# Patient Record
Sex: Female | Born: 1965 | Race: White | Hispanic: No | Marital: Single | Smoking: Never smoker
Health system: Southern US, Community
[De-identification: ages and names within clinical notes are randomized; demographics above are authoritative.]

## PROBLEM LIST (undated history)

## (undated) DIAGNOSIS — M1990 Unspecified osteoarthritis, unspecified site: Secondary | ICD-10-CM

## (undated) DIAGNOSIS — T7840XA Allergy, unspecified, initial encounter: Secondary | ICD-10-CM

## (undated) DIAGNOSIS — R011 Cardiac murmur, unspecified: Secondary | ICD-10-CM

## (undated) DIAGNOSIS — F419 Anxiety disorder, unspecified: Secondary | ICD-10-CM

## (undated) DIAGNOSIS — E785 Hyperlipidemia, unspecified: Secondary | ICD-10-CM

## (undated) DIAGNOSIS — E039 Hypothyroidism, unspecified: Secondary | ICD-10-CM

## (undated) DIAGNOSIS — K219 Gastro-esophageal reflux disease without esophagitis: Secondary | ICD-10-CM

## (undated) DIAGNOSIS — F32A Depression, unspecified: Secondary | ICD-10-CM

## (undated) DIAGNOSIS — D649 Anemia, unspecified: Secondary | ICD-10-CM

## (undated) DIAGNOSIS — E119 Type 2 diabetes mellitus without complications: Secondary | ICD-10-CM

## (undated) HISTORY — PX: WISDOM TOOTH EXTRACTION: SHX21

## (undated) HISTORY — DX: Anemia, unspecified: D64.9

## (undated) HISTORY — DX: Depression, unspecified: F32.A

## (undated) HISTORY — DX: Hyperlipidemia, unspecified: E78.5

## (undated) HISTORY — DX: Allergy, unspecified, initial encounter: T78.40XA

## (undated) HISTORY — DX: Hypothyroidism, unspecified: E03.9

## (undated) HISTORY — DX: Anxiety disorder, unspecified: F41.9

## (undated) HISTORY — DX: Gastro-esophageal reflux disease without esophagitis: K21.9

## (undated) HISTORY — DX: Type 2 diabetes mellitus without complications: E11.9

## (undated) HISTORY — DX: Cardiac murmur, unspecified: R01.1

## (undated) HISTORY — DX: Unspecified osteoarthritis, unspecified site: M19.90

---

## 2000-08-06 ENCOUNTER — Encounter: Admission: RE | Admit: 2000-08-06 | Discharge: 2000-08-06 | Payer: Self-pay | Admitting: Internal Medicine

## 2000-08-06 ENCOUNTER — Encounter: Payer: Self-pay | Admitting: Internal Medicine

## 2003-11-17 ENCOUNTER — Encounter: Admission: RE | Admit: 2003-11-17 | Discharge: 2003-11-17 | Payer: Self-pay | Admitting: Family Medicine

## 2005-06-26 ENCOUNTER — Encounter: Admission: RE | Admit: 2005-06-26 | Discharge: 2005-06-26 | Payer: Self-pay | Admitting: Family Medicine

## 2005-07-19 ENCOUNTER — Encounter: Admission: RE | Admit: 2005-07-19 | Discharge: 2005-07-19 | Payer: Self-pay | Admitting: Family Medicine

## 2006-10-11 ENCOUNTER — Encounter: Admission: RE | Admit: 2006-10-11 | Discharge: 2006-10-11 | Payer: Self-pay | Admitting: Family Medicine

## 2008-05-28 ENCOUNTER — Other Ambulatory Visit: Admission: RE | Admit: 2008-05-28 | Discharge: 2008-05-28 | Payer: Self-pay | Admitting: Family Medicine

## 2008-05-28 ENCOUNTER — Ambulatory Visit: Payer: Self-pay | Admitting: Family Medicine

## 2008-05-28 ENCOUNTER — Encounter: Payer: Self-pay | Admitting: Family Medicine

## 2008-05-28 DIAGNOSIS — E039 Hypothyroidism, unspecified: Secondary | ICD-10-CM

## 2008-05-28 DIAGNOSIS — K219 Gastro-esophageal reflux disease without esophagitis: Secondary | ICD-10-CM

## 2008-05-28 DIAGNOSIS — G5600 Carpal tunnel syndrome, unspecified upper limb: Secondary | ICD-10-CM

## 2008-05-28 DIAGNOSIS — E785 Hyperlipidemia, unspecified: Secondary | ICD-10-CM

## 2008-05-31 ENCOUNTER — Encounter (INDEPENDENT_AMBULATORY_CARE_PROVIDER_SITE_OTHER): Payer: Self-pay | Admitting: *Deleted

## 2008-06-03 ENCOUNTER — Encounter (INDEPENDENT_AMBULATORY_CARE_PROVIDER_SITE_OTHER): Payer: Self-pay | Admitting: *Deleted

## 2008-06-07 ENCOUNTER — Encounter: Payer: Self-pay | Admitting: Family Medicine

## 2009-05-18 ENCOUNTER — Ambulatory Visit: Payer: Self-pay | Admitting: Family Medicine

## 2009-05-18 ENCOUNTER — Telehealth (INDEPENDENT_AMBULATORY_CARE_PROVIDER_SITE_OTHER): Payer: Self-pay | Admitting: *Deleted

## 2009-05-18 DIAGNOSIS — J209 Acute bronchitis, unspecified: Secondary | ICD-10-CM

## 2009-08-25 ENCOUNTER — Ambulatory Visit: Payer: Self-pay | Admitting: Family Medicine

## 2009-08-25 DIAGNOSIS — L738 Other specified follicular disorders: Secondary | ICD-10-CM | POA: Insufficient documentation

## 2009-09-21 ENCOUNTER — Telehealth (INDEPENDENT_AMBULATORY_CARE_PROVIDER_SITE_OTHER): Payer: Self-pay | Admitting: *Deleted

## 2009-10-03 ENCOUNTER — Ambulatory Visit: Payer: Self-pay | Admitting: Family Medicine

## 2009-10-03 ENCOUNTER — Other Ambulatory Visit: Admission: RE | Admit: 2009-10-03 | Discharge: 2009-10-03 | Payer: Self-pay | Admitting: Family Medicine

## 2009-10-04 ENCOUNTER — Encounter: Admission: RE | Admit: 2009-10-04 | Discharge: 2009-10-04 | Payer: Self-pay | Admitting: Family Medicine

## 2009-10-17 ENCOUNTER — Encounter: Payer: Self-pay | Admitting: Family Medicine

## 2009-10-24 ENCOUNTER — Encounter: Payer: Self-pay | Admitting: Family Medicine

## 2009-10-24 ENCOUNTER — Telehealth (INDEPENDENT_AMBULATORY_CARE_PROVIDER_SITE_OTHER): Payer: Self-pay | Admitting: *Deleted

## 2009-10-25 ENCOUNTER — Ambulatory Visit: Payer: Self-pay | Admitting: Family Medicine

## 2009-10-25 DIAGNOSIS — M79609 Pain in unspecified limb: Secondary | ICD-10-CM | POA: Insufficient documentation

## 2009-11-11 ENCOUNTER — Telehealth: Payer: Self-pay | Admitting: Family Medicine

## 2009-12-29 ENCOUNTER — Ambulatory Visit: Payer: Self-pay | Admitting: Family Medicine

## 2010-04-16 ENCOUNTER — Encounter: Payer: Self-pay | Admitting: Family Medicine

## 2010-04-23 LAB — CONVERTED CEMR LAB
AST: 14 units/L (ref 0–37)
Albumin: 4 g/dL (ref 3.5–5.2)
Albumin: 4.4 g/dL (ref 3.5–5.2)
Basophils Relative: 0.8 % (ref 0.0–3.0)
Blood in Urine, dipstick: NEGATIVE
CO2: 22 meq/L (ref 19–32)
CO2: 27 meq/L (ref 19–32)
Chloride: 102 meq/L (ref 96–112)
Chloride: 109 meq/L (ref 96–112)
Cholesterol: 207 mg/dL — ABNORMAL HIGH (ref 0–200)
Creatinine, Ser: 0.8 mg/dL (ref 0.4–1.2)
Eosinophils Absolute: 0.3 10*3/uL (ref 0.0–0.7)
Free T4: 0.81 ng/dL (ref 0.60–1.60)
Glucose, Urine, Semiquant: NEGATIVE
HCT: 33.2 % — ABNORMAL LOW (ref 36.0–46.0)
HDL: 46 mg/dL (ref >39)
Hemoglobin: 11.1 g/dL — ABNORMAL LOW (ref 12.0–15.0)
Hemoglobin: 12.5 g/dL (ref 12.0–15.0)
Ketones, urine, test strip: NEGATIVE
LDL Cholesterol: 129 mg/dL — ABNORMAL HIGH (ref 0–99)
Lymphs Abs: 1.8 10*3/uL (ref 0.7–4.0)
Lymphs Abs: 2.7 10*3/uL (ref 0.7–4.0)
MCHC: 33.4 g/dL (ref 30.0–36.0)
MCV: 78.3 fL (ref 78.0–100.0)
Monocytes Absolute: 0.5 10*3/uL (ref 0.1–1.0)
Monocytes Relative: 6 % (ref 3–12)
Neutro Abs: 1.9 10*3/uL (ref 1.4–7.7)
Neutro Abs: 6.5 10*3/uL (ref 1.7–7.7)
Neutrophils Relative %: 35 % — ABNORMAL LOW (ref 43.0–77.0)
Neutrophils Relative %: 72 % (ref 43–77)
Nitrite: NEGATIVE
Potassium: 4.2 meq/L (ref 3.5–5.3)
RBC: 4.24 M/uL (ref 3.87–5.11)
RBC: 4.83 M/uL (ref 3.87–5.11)
Sodium: 138 meq/L (ref 135–145)
Specific Gravity, Urine: 1.02
T3, Free: 2.8 pg/mL (ref 2.3–4.2)
T3, Free: 3 pg/mL (ref 2.3–4.2)
TSH: 3.06 microintl units/mL (ref 0.35–5.50)
TSH: 3.133 microintl units/mL (ref 0.350–4.500)
Total Bilirubin: 0.7 mg/dL (ref 0.3–1.2)
Total CHOL/HDL Ratio: 4
Total CHOL/HDL Ratio: 4.1
Total Protein: 6.7 g/dL (ref 6.0–8.3)
pH: 5

## 2010-04-25 NOTE — Letter (Signed)
Summary: [facility]  [facility]   Imported By: Lanelle Bal 10/28/2009 10:02:33  _____________________________________________________________________  External Attachment:    Type:   Image     Comment:   External Document

## 2010-04-25 NOTE — Progress Notes (Signed)
Summary: Medication Request--l/m 8-1  Phone Note Call from Patient Call back at Home Phone [phone]   Caller: Patient Call For: Loreen Freud DO Summary of Call: Patient saw Dr. Beverely Low back in May or June for a rash under her arms.  She was prescribed an antibiotic which didn't make it go away completely.  Patient is requesting a stronger antibiotic that will make it go completely away. Initial call taken by: Barnie Mort,  October 24, 2009 1:54 PM  Follow-up for Phone Call        she would have to come in and let me see it. Follow-up by: Loreen Freud DO,  October 24, 2009 2:47 PM  Additional Follow-up for Phone Call Additional follow up Details #1::        left message on voicemail to call back to office. Lucious Groves CMA  October 24, 2009 3:05 PM     Additional Follow-up for Phone Call Additional follow up Details #2::    pt called back and has an appt tomorrow 8/2 @ 2pm..Kalyn Negrete  October 24, 2009 3:11 PM

## 2010-04-25 NOTE — Assessment & Plan Note (Signed)
Summary: cpx//lch   Vital Signs:  Patient profile:   45 year old female Height:      67 inches Weight:      206 pounds Temp:     98.6 degrees F oral Pulse rate:   62 / minute Resp:     18 per minute BP sitting:   141 / 76  (left arm)  Vitals Entered By: Jeremy Johann CMA (October 03, 2009 1:16 PM) CC: [email or web address],pap Comments REVIEWED MED LIST, PATIENT AGREED DOSE AND INSTRUCTION CORRECT    History of Present Illness: Pt here for cpe, pap and labs.   Pt has tried coming off minocycline but breaks out whenever she does.  She would like lower dose though.    Preventive Screening-Counseling & Management  Alcohol-Tobacco     Alcohol drinks/day: 0     Alcohol type: wine     Smoking Status: never     Passive Smoke Exposure: no  Caffeine-Diet-Exercise     Caffeine use/day: 1     Does Patient Exercise: no  Hep-HIV-STD-Contraception     HIV Risk: no     Sun Exposure-Excessive: occasionally  Safety-Violence-Falls     Seat Belt Use: 100  Current Medications (verified): 1)  Prilosec Otc 20 Mg Tbec (Omeprazole Magnesium) .... Take 1 Tab Once Daily 2)  Minocin 50 Mg Caps (Minocycline Hcl) .Marland Kitchen.. 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 HTN-father breast CA-maternal GM, mother F--nonmalignant brain tumor, CHF, HTN  Social History: Reviewed history from 05/28/2008 and no changes required. 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 Eyes:  Denies blurring, discharge, double vision, eye irritation, eye pain, halos, itching, light sensitivity, red eye, vision loss-1 eye, and vision loss-both eyes; optho--q1y. ENT:  Denies decreased hearing, difficulty swallowing, ear discharge, earache, hoarseness, nasal congestion, nosebleeds, postnasal drainage, ringing in ears, sinus pressure, and sore throat. CV:  Denies bluish discoloration of lips or nails, chest pain or discomfort, difficulty breathing at night, difficulty breathing while lying down, fainting, fatigue, leg cramps with exertion, lightheadness, near fainting, palpitations, shortness of breath with exertion, swelling of feet, swelling of hands, and weight gain. Resp:  Denies chest discomfort, chest pain with inspiration, cough, coughing up blood, excessive snoring, hypersomnolence, morning headaches, pleuritic, shortness of breath, sputum productive, and wheezing. GI:  Denies abdominal pain, bloody stools, change in bowel habits, constipation, dark tarry stools, diarrhea, excessive appetite, gas, hemorrhoids, indigestion, loss of appetite, nausea, vomiting, vomiting blood, and yellowish skin color. GU:  Denies abnormal vaginal bleeding, decreased libido, discharge, dysuria, genital sores, hematuria, incontinence, nocturia, urinary frequency, and urinary hesitancy. MS:  Denies joint pain, joint redness, joint swelling, loss of strength, low back  pain, mid back pain, muscle aches, muscle , cramps, muscle weakness, stiffness, and  thoracic pain. Derm:  Denies changes in color of skin, changes in nail beds, dryness, excessive perspiration, flushing, hair loss, insect bite(s), itching, lesion(s), poor wound healing, and rash. Neuro:  Denies brief paralysis, difficulty with concentration, disturbances in coordination, falling down, headaches, inability to speak, memory loss, numbness, poor balance, seizures, sensation of room spinning, tingling, tremors, visual disturbances, and weakness. Psych:  Denies alternate hallucination ( auditory/visual), anxiety, depression, easily angered, easily tearful, irritability, mental problems, panic attacks, sense of great danger, suicidal thoughts/plans, thoughts of violence, unusual visions or sounds, and thoughts /plans of harming others. Endo:  Denies cold intolerance, excessive hunger, excessive thirst, excessive urination, heat intolerance, polyuria, and weight change. Heme:  Denies abnormal bruising, bleeding, enlarge lymph nodes, fevers, pallor, and skin discoloration. Allergy:  Denies hives or rash, itching eyes, persistent infections, seasonal allergies, and sneezing.  Physical Exam  General:  Well-developed,well-nourished,in no acute distress; alert,appropriate and cooperative throughout examination Head:  Normocephalic and atraumatic without obvious abnormalities. No apparent alopecia or balding. Eyes:  vision grossly intact, pupils equal, pupils round, pupils reactive to light, and no injection.   Ears:  External ear exam shows no significant lesions or deformities.  Otoscopic examination reveals clear canals, tympanic membranes are intact bilaterally without bulging, retraction, inflammation or discharge. Hearing is grossly normal bilaterally. Nose:  External nasal examination shows no deformity or inflammation. Nasal mucosa are pink and moist without lesions or exudates. Mouth:  Oral mucosa and oropharynx without lesions or exudates.  Teeth in good repair. Neck:  No deformities,  masses, or tenderness noted.no carotid bruits.   Chest Wall:  No deformities, masses, or tenderness noted. Breasts:  No mass, nodules, thickening, tenderness, bulging, retraction, inflamation, nipple discharge or skin changes noted.   Lungs:  Normal respiratory effort, chest expands symmetrically. Lungs are clear to auscultation, no crackles or wheezes. Heart:  normal rate and no murmur.   Abdomen:  Bowel sounds positive,abdomen soft and non-tender without masses, organomegaly or hernias noted. Rectal:  No external abnormalities noted. Normal sphincter tone. No rectal masses or tenderness. Genitalia:  Pelvic Exam:        External: normal female genitalia without lesions or masses        Vagina: normal without lesions or masses        Cervix: normal without lesions or masses        Adnexa: normal bimanual exam without masses or fullness        Uterus: normal by palpation        Pap smear: performed Msk:  normal ROM, no joint tenderness, no joint swelling, no joint warmth, no redness over joints, no joint deformities, no joint instability, and no crepitation.   Pulses:  R posterior tibial normal, R dorsalis pedis normal, R carotid normal, L posterior tibial normal, L dorsalis pedis normal, and L carotid normal.   Extremities:  No clubbing, cyanosis, edema, or deformity noted with normal full range of motion of all joints.   Neurologic:  No cranial nerve deficits noted. Station and gait are normal. Plantar reflexes are down-going bilaterally. DTRs are symmetrical throughout. Sensory, motor and coordinative functions appear intact. Skin:  Intact without suspicious lesions or rashes Cervical Nodes:  No lymphadenopathy noted Axillary Nodes:  No palpable lymphadenopathy Psych:  Cognition and judgment appear intact. Alert and cooperative with normal attention span and concentration. No apparent delusions, illusions, hallucinations   Impression & Recommendations:  Problem # 1:  PREVENTIVE HEALTH  CARE (ICD-V70.0) ghm utd Orders: Radiology Referral (Radiology) Venipuncture [phone]) TLB-Lipid Panel (80061-LIPID) TLB-BMP (Basic Metabolic Panel-BMET) (80048-METABOL) TLB-CBC Platelet - w/Differential (85025-CBCD) TLB-Hepatic/Liver Function Pnl (80076-HEPATIC) TLB-TSH (Thyroid Stimulating Hormone) (84443-TSH) TLB-T3, Free (Triiodothyronine) (84481-T3FREE) TLB-T4 (Thyrox), Free [phone]) EKG w/ Interpretation (93000)  Problem # 2:  HYPOTHYROIDISM (ICD-244.9)  Orders: Venipuncture (78469) TLB-Lipid Panel (80061-LIPID) TLB-BMP (Basic Metabolic Panel-BMET) (80048-METABOL) TLB-CBC Platelet - w/Differential (85025-CBCD) TLB-Hepatic/Liver Function Pnl (80076-HEPATIC) TLB-TSH (Thyroid Stimulating Hormone) (84443-TSH) TLB-T3, Free (Triiodothyronine) (84481-T3FREE) TLB-T4 (Thyrox), Free (62952-WU1L) EKG w/ Interpretation (93000)  Problem # 3:  HYPERLIPIDEMIA (ICD-272.4)  Orders: Venipuncture (24401) TLB-Lipid Panel (80061-LIPID) TLB-BMP (Basic Metabolic Panel-BMET) (80048-METABOL) TLB-CBC Platelet - w/Differential (85025-CBCD) TLB-Hepatic/Liver Function Pnl (80076-HEPATIC) TLB-TSH (Thyroid Stimulating Hormone) (84443-TSH) TLB-T3, Free (Triiodothyronine) (84481-T3FREE) TLB-T4 (Thyrox), Free (02725-DG6Y) EKG w/ Interpretation (93000)  Complete Medication List: 1)  Prilosec Otc 20 Mg Tbec (Omeprazole magnesium) .... Take 1 tab once daily 2)  Minocin 50 Mg Caps (Minocycline hcl) .Marland Kitchen.. 1 by mouth once daily Prescriptions: PRILOSEC OTC 20 MG TBEC (OMEPRAZOLE MAGNESIUM) take 1 tab once daily  #90 x 3   Entered and Authorized by:   Loreen Freud DO   Signed by:   Loreen Freud DO on 10/03/2009   Method used:   Electronically to        [facility] (retail)       [address]       [place], [place]  [address]       Ph: [phone]       Fax: [phone]   RxID:   [id number] MINOCIN 50 MG CAPS (MINOCYCLINE HCL) 1 by mouth once daily  #30  x 5   Entered and Authorized by:   Loreen Freud DO   Signed by:   Loreen Freud DO on 10/03/2009   Method used:   Electronically to        [facility] (retail)       [address]       [place], [place]  [address]       Ph: [phone]       Fax: [phone]   RxID:   [id number]    EKG  Procedure date:  10/03/2009  Findings:      sinus rhythm 67 bpm   Appended Document: cpx//lch   Tetanus/Td Vaccine    Vaccine Type: Tdap    Site: right deltoid    Mfr: GlaxoSmithKline    Dose: 0.5 ml    Route: IM    Given by: Jeremy Johann CMA    Exp. Date: 10/09/2010    Lot #: [id number]    VIS given: 02/11/07 version given October 03, 2009.

## 2010-04-25 NOTE — Assessment & Plan Note (Signed)
Summary: rash under both armpits//lch   Vital Signs:  Patient profile:   45 year old female Weight:      203 pounds Temp:     99.2 degrees F oral BP sitting:   120 / 70  (left arm)  Vitals Entered By: Doristine Devoid (August 25, 2009 2:57 PM)  History of Present Illness: 45 yo woman here today for rash under her armpits.  started 'a couple of months' ago.  'like i'm breaking out under my armpits'.  'everything irritates it- shaving, not shaving, wetness, deodorant'.  has had similar previous springs but this resolved spontaneously.  denies new deodorant, detergent, razor, soaps.  not itchy, but painful.  some of the areas have whiteheads.  Current Medications (verified): 1)  Prilosec Otc 20 Mg Tbec (Omeprazole Magnesium) .... Take 1 Tab Once Daily 2)  Minocycline Hcl 100 Mg Tabs (Minocycline Hcl) .Marland Kitchen.. 1 By Mouth Daily 3)  Cephalexin 500 Mg  Tabs (Cephalexin) .... Take One By Mouth 2 Times Daily X10 Days  Allergies (verified): No Known Drug Allergies  Review of Systems      See HPI  Physical Exam  General:  Well-developed,well-nourished,in no acute distress; alert,appropriate and cooperative throughout examination Skin:  folliculitis in axilla bilaterally, L>R.  some areas open and raw, others w/ whiteheads. Axillary Nodes:  mild LAD bilaterally   Impression & Recommendations:  Problem # 1:  FOLLICULITIS (ICD-704.8) Assessment New not consistent w/ appearance of MRSA.  start Keflex.  reviewed supportive care and red flags that should prompt return.  Pt expresses understanding and is in agreement w/ this plan.  Complete Medication List: 1)  Prilosec Otc 20 Mg Tbec (Omeprazole magnesium) .... Take 1 tab once daily 2)  Minocycline Hcl 100 Mg Tabs (Minocycline hcl) .Marland Kitchen.. 1 by mouth daily 3)  Cephalexin 500 Mg Tabs (Cephalexin) .... Take one by mouth 2 times daily x10 days  Patient Instructions: 1)  Use the Cephalexin as directed- take w/ food to avoid upset stomach 2)  Apply hot  compresses to the areas 3)  Avoid shaving 4)  Hydrocortisone cream as needed 5)  Use deodorant for sensitive skin 6)  If worsening or not improving- please call 7)  Hang in there! Prescriptions: CEPHALEXIN 500 MG  TABS (CEPHALEXIN) take one by mouth 2 times daily x10 days  #20 x 0   Entered and Authorized by:   Neena Rhymes MD   Signed by:   Neena Rhymes MD on 08/25/2009   Method used:   Electronically to        [facility] (retail)       [address]       [place], [place]  [address]       Ph: [phone]       Fax: [phone]   RxID:   [id number]

## 2010-04-25 NOTE — Assessment & Plan Note (Signed)
Summary: WHEEZING/KDC   Vital Signs:  Patient profile:   45 year old female Height:      67 inches Weight:      206 pounds BMI:     32.38 Temp:     98.2 degrees F oral Pulse rate:   76 / minute Pulse rhythm:   regular BP sitting:   122 / 80  (left arm) Cuff size:   regular  Vitals Entered By: Army Fossa CMA (May 18, 2009 11:30 AM) CC: Pt states that her breathing has changed, she breaths more heavily. She states that she is not SOB, last night she noticied some wheezing. , Cough   History of Present Illness:  Cough      This is a 45 year old woman who presents with Cough.  The patient reports productive cough and wheezing, but denies non-productive cough, pleuritic chest pain, shortness of breath, exertional dyspnea, fever, hemoptysis, and malaise.  The patient denies the following symptoms: cold/URI symptoms, sore throat, nasal congestion, chronic rhinitis, weight loss, acid reflux symptoms, and peripheral edema.  The cough is worse with lying down.  Ineffective prior treatments have included OTC cough medication.    Current Medications (verified): 1)  Prilosec Otc 20 Mg Tbec (Omeprazole Magnesium) .... Take 1 Tab Once Daily 2)  Minocycline Hcl 100 Mg Tabs (Minocycline Hcl) .Marland Kitchen.. 1 By Mouth Daily 3)  Zithromax Z-Pak 250 Mg Tabs (Azithromycin) .... As Directed  Allergies (verified): No Known Drug Allergies  Past History:  Past medical, surgical, family and social histories (including risk factors) reviewed for relevance to current acute and chronic problems.  Past Medical History: Reviewed history from 05/28/2008 and no changes required. GERD Hyperlipidemia Hypothyroidism  Past Surgical History: Reviewed history from 05/28/2008 and no changes required. none  Family History: Reviewed history from 05/28/2008 and no changes required. HTN-father breast CA-maternal GM, mother F--nonmalignant brain tumor, CHF, HTN  Social History: Reviewed history from  05/28/2008 and no changes required. 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  Otoscopic examination reveals clear canals, tympanic membranes are intact bilaterally without bulging, retraction, inflammation or discharge. Hearing is grossly normal bilaterally. Nose:  External nasal examination shows no deformity or inflammation. Nasal mucosa are pink and moist without lesions or exudates. Mouth:  Oral mucosa and oropharynx without lesions or exudates.  Teeth in good repair. Neck:  No deformities, masses, or tenderness noted. Lungs:  R decreased breath sounds and L decreased breath sounds.   Heart:  Normal rate and regular rhythm. S1 and S2 normal without gallop, murmur, click, rub or other extra sounds. Extremities:  No clubbing, cyanosis, edema, or deformity noted with normal full range of motion of all joints.   Skin:  Intact without suspicious lesions or rashes Cervical Nodes:  No lymphadenopathy noted Psych:  Cognition and judgment appear intact. Alert and cooperative with normal attention span and concentration. No apparent delusions, illusions, hallucinations   Impression & Recommendations:  Problem # 1:  ACUTE BRONCHITIS (ICD-466.0)  Her updated medication list for this problem includes:    Minocycline Hcl 100 Mg Tabs (Minocycline hcl) .Marland Kitchen... 1 by mouth daily    Zithromax Z-pak 250 Mg Tabs (Azithromycin) .Marland Kitchen... As directed    mucinex as needed   Orders: Nebulizer Tx (  54098) Albuterol Sulfate Sol 1mg  unit dose (J1914)  Take antibiotics and other medications as directed. Encouraged to push clear liquids, get enough rest, and take acetaminophen as needed. To be seen in 5-7 days if no  improvement, sooner if worse.  Complete Medication List: 1)  Prilosec Otc 20 Mg Tbec (Omeprazole magnesium) .... Take 1 tab once daily 2)  Minocycline Hcl 100 Mg Tabs (Minocycline hcl) .Marland Kitchen.. 1 by mouth daily 3)  Zithromax Z-pak 250 Mg Tabs (Azithromycin) .... As directed Prescriptions: ZITHROMAX Z-PAK 250 MG TABS (AZITHROMYCIN) as directed  #1 x 0   Entered and Authorized by:   Loreen Freud DO   Signed by:   Loreen Freud DO on 05/18/2009   Method used:   Electronically to        [facility] (retail)       [address]       [place], [place]  [address]       Ph: [phone]       Fax: [phone]   RxID:   [id number]    Medication Administration  Medication # 1:    Medication: Albuterol Sulfate Sol 1mg  unit dose    Diagnosis: ACUTE BRONCHITIS (ICD-466.0)  Orders Added: 1)  Nebulizer Tx [40347] 2)  Albuterol Sulfate Sol 1mg  unit dose [J7613] 3)  Est. Patient Level IV [42595]

## 2010-04-25 NOTE — Consult Note (Signed)
Summary: [facility]   Imported By: Lanelle Bal 11/03/2009 09:43:59  _____________________________________________________________________  External Attachment:    Type:   Image     Comment:   External Document

## 2010-04-25 NOTE — Progress Notes (Signed)
Summary: PHONE  Phone Note Call from Patient Call back at [facility] Phone [phone]   Caller: Patient Summary of Call: pATIENT STATES SHE IS WHEEZING. PATIENT STATES SHE WOKE UP IN THE MIDDLE OF THE NIGHT HAVING TROUBLE BREATHIG. GAVE PATIENT APP FOT THIS AM. IS THIS OK.  Follow-up for Phone Call        left message to call office to get further detail..................Marland KitchenFelecia Deloach CMA  May 18, 2009 8:31 AM   Additional Follow-up for Phone Call Additional follow up Details #1::        pt c/o heavy breathing, wheezing and rattling in chest, cough up brownish mucous. pt has pending appt this AM advise to keep appt but if airway feel constricted or breathing become difficult pt advise  ED, pt ok................Marland KitchenFelecia Deloach CMA  May 18, 2009 8:58 AM

## 2010-04-25 NOTE — Progress Notes (Signed)
Summary: Refill Request  Phone Note Refill Request Call back at [phone] Message from:  Pharmacy on September 21, 2009 3:34 PM  Refills Requested: Medication #1:  MINOCYCLINE HCL 100 MG TABS 1 by mouth daily.   Dosage confirmed as above?Dosage Confirmed   Supply Requested: 1 month   Last Refilled: 04/23/2009 [facility] [address]  Next Appointment Scheduled: 7.11.11 Initial call taken by: Harold Barban,  September 21, 2009 3:34 PM    New/Updated Medications: MINOCYCLINE HCL 100 MG TABS (MINOCYCLINE HCL) 1 by mouth daily ** APPOINTMENT DUE** Prescriptions: MINOCYCLINE HCL 100 MG TABS (MINOCYCLINE HCL) 1 by mouth daily ** APPOINTMENT DUE**  #30 x 0   Entered by:   Shonna Chock   Authorized by:   Loreen Freud DO   Signed by:   Shonna Chock on 09/21/2009   Method used:   Electronically to        [facility] (retail)       [address]       [place], [place]  [address]       Ph: [phone]       Fax: [phone]   RxID:   [id number]

## 2010-04-25 NOTE — Assessment & Plan Note (Signed)
Summary: RE OCCURING OF OLD DX//PH   Vital Signs:  Patient profile:   45 year old female Weight:      207.8 pounds Temp:     99.2 degrees F oral Pulse rate:   64 / minute Pulse rhythm:   regular BP sitting:   110 / 70  (right arm) Cuff size:   large  Vitals Entered By: Almeta Monas CMA Duncan Dull) (December 29, 2009 11:23 AM) CC: C/O FOLLICULITIS UNDER BOTH ARMS   History of Present Illness: Pt here c/o recurrent folliculitis.  Pt has been on bactrim in the past with good success.  No other complaints.    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 Take 1 Tab Once Daily 2)  Minocin 50 Mg Caps (Minocycline Hcl) .Marland Kitchen.. 1 By Mouth Once Daily 3)  Bactrim Ds 800-160 Mg Tabs (Sulfamethoxazole-Trimethoprim) .Marland Kitchen.. 1 By Mouth Two Times A Day For 7 Days.  Current Medications (verified): 1)  Prilosec Otc 20 Mg Tbec (Omeprazole Magnesium) .... Take 1 Tab Once Daily 2)  Minocin 50 Mg Caps (Minocycline Hcl) .Marland Kitchen.. 1 By Mouth Once Daily 3)  Bactrim Ds 800-160 Mg Tabs (Sulfamethoxazole-Trimethoprim) .Marland Kitchen.. 1 By Mouth Two Times A Day  Allergies (verified): No Known Drug Allergies  Past History:  Past medical, surgical, family and social histories (including risk factors) reviewed for relevance to current acute and chronic problems.  Past Medical History: Reviewed history from 05/28/2008 and no changes required. GERD Hyperlipidemia Hypothyroidism  Past Surgical History: Reviewed history from 05/28/2008 and no changes required. none  Family History: Reviewed history from 05/28/2008 and no changes required. HTN-father breast CA-maternal GM,  mother F--nonmalignant brain tumor, CHF, HTN  Social History: Reviewed history from 10/03/2009 and no changes required. 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 Alert and cooperative with normal attention span and concentration. No apparent delusions, illusions, hallucinations   Impression & Recommendations:  Problem # 1:  FOLLICULITIS (ICD-704.8) bactrim ds for 10 days  rto as needed] warm compresses  Complete Medication List: 1)  Prilosec Otc 20 Mg Tbec (Omeprazole magnesium) .... Take 1 tab once daily 2)  Minocin 50 Mg Caps (Minocycline hcl) .Marland Kitchen.. 1 by mouth once daily 3)  Bactrim Ds 800-160 Mg Tabs (Sulfamethoxazole-trimethoprim) .Marland Kitchen.. 1 by mouth two times a day Prescriptions: BACTRIM DS 800-160 MG TABS (SULFAMETHOXAZOLE-TRIMETHOPRIM) 1 by mouth two times a day  #20 x 0   Entered and Authorized by:   Loreen Freud DO   Signed by:   Loreen Freud DO on 12/29/2009   Method used:   Electronically to        [facility] (retail)       [address]       [place], [place]  [address]       Ph: [phone]  Fax: [phone]   RxID:   [id number]

## 2010-04-25 NOTE — Progress Notes (Signed)
Summary: question  Phone Note Call from Patient   Summary of Call: pt left VM that she finished med yesterday for infected hair follice. pt states that she still has some sore  Follow-up for Phone Call        Should the patient try another round of meds or be seen in office. Please advise. Follow-up by: Lucious Groves CMA,  November 11, 2009 3:23 PM  Additional Follow-up for Phone Call Additional follow up Details #1::        bactrim ds 1 by mouth two times a day for 7 days ---ov if still not improving Additional Follow-up by: Loreen Freud DO,  November 11, 2009 3:49 PM    Additional Follow-up for Phone Call Additional follow up Details #2::    Pt is aware. Army Fossa CMA  November 11, 2009 3:51 PM   New/Updated Medications: BACTRIM DS 800-160 MG TABS (SULFAMETHOXAZOLE-TRIMETHOPRIM) 1 by mouth two times a day for 7 days. Prescriptions: BACTRIM DS 800-160 MG TABS (SULFAMETHOXAZOLE-TRIMETHOPRIM) 1 by mouth two times a day for 7 days.  #14 x 0   Entered by:   Army Fossa CMA   Authorized by:   Loreen Freud DO   Signed by:   Army Fossa CMA on 11/11/2009   Method used:   Electronically to        [facility] (retail)       [address]       [place], [place]  [address]       Ph: [phone]       Fax: [phone]   RxID:   [id number]

## 2010-04-25 NOTE — Letter (Signed)
Summary: Cancer Screening/[facility] Personalized Risk Profile  Cancer Screening/[facility] Personalized Risk Profile   Imported By: Lanelle Bal 10/06/2009 13:27:56  _____________________________________________________________________  External Attachment:    Type:   Image     Comment:   External Document

## 2010-04-25 NOTE — Assessment & Plan Note (Signed)
Summary: rash under arms//kn   Vital Signs:  Patient profile:   45 year old female Height:      67 inches (170.18 cm) Weight:      206.25 pounds (93.75 kg) BMI:     32.42 Temp:     98.8 degrees F (37.11 degrees C) oral BP sitting:   138 / 80  (left arm) Cuff size:   regular  Vitals Entered By: Lucious Groves CMA (October 25, 2009 2:28 PM) CC: C/O rash that has returned under her arms and needs pinky looked at./kb Is Patient Diabetic? No Pain Assessment Patient in pain? no      Comments Patient notes that the rash had gotten better previously but returned. Patient notes that it is related to shaving/deodorant./kb   History of Present Illness: Pt here c/o rash under arm again --- see Dr Rennis Golden note.  Pt also c/o feeling pop in L pinky while pushing something.  Pt is unable to straighten pinky and it is painful.  Current Medications (verified): 1)  Prilosec Otc 20 Mg Tbec (Omeprazole Magnesium) .... Take 1 Tab Once Daily 2)  Minocin 50 Mg Caps (Minocycline Hcl) .Marland Kitchen.. 1 By Mouth Once Daily 3)  Keflex 500 Mg Caps (Cephalexin) .Marland Kitchen.. 1 By Mouth Two Times A Day  Allergies (verified): No Known Drug Allergies  Past History:  Past medical, surgical, family and social histories (including risk factors) reviewed for relevance to current acute and chronic problems.  Past Medical History: Reviewed history from 05/28/2008 and no changes required. GERD Hyperlipidemia Hypothyroidism  Past Surgical History: Reviewed history from 05/28/2008 and no changes required. none  Family History: Reviewed history from 05/28/2008 and no changes required. HTN-father breast CA-maternal GM, mother F--nonmalignant brain tumor, CHF, HTN  Social History: Reviewed history from 10/03/2009 and no changes required. 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     Impression & Recommendations:  Problem # 1:  FOLLICULITIS (ICD-704.8) keflex for 2 weeks  Problem # 2:  FINGER PAIN (ICD-729.5)  Orders: T-Hand Left 3 Views (73130TC) Splints- All Types (E4540)  Complete Medication List: 1)  Prilosec Otc 20 Mg Tbec (Omeprazole magnesium) .... Take 1 tab once daily 2)  Minocin 50 Mg Caps (Minocycline hcl) .Marland Kitchen.. 1 by mouth once daily 3)  Keflex 500 Mg Caps (Cephalexin) .Marland Kitchen.. 1 by mouth two times a day Prescriptions: KEFLEX 500 MG CAPS (CEPHALEXIN) 1 by mouth two times a day  #28 x 0   Entered and Authorized by:   Loreen Freud DO   Signed by:   Loreen Freud DO on 10/25/2009   Method used:   Electronically to        [facility] (retail)       [address]       [place], [place]  [address]       Ph: [phone]       Fax: [phone]   RxID:   [id number]

## 2010-07-10 ENCOUNTER — Ambulatory Visit (INDEPENDENT_AMBULATORY_CARE_PROVIDER_SITE_OTHER): Payer: BC Managed Care – PPO | Admitting: Internal Medicine

## 2010-07-10 ENCOUNTER — Encounter: Payer: Self-pay | Admitting: Internal Medicine

## 2010-07-10 DIAGNOSIS — Z9109 Other allergy status, other than to drugs and biological substances: Secondary | ICD-10-CM

## 2010-07-10 DIAGNOSIS — J309 Allergic rhinitis, unspecified: Secondary | ICD-10-CM

## 2010-07-10 MED ORDER — PREDNISONE 10 MG PO TABS: ORAL_TABLET | ORAL | Status: AC

## 2010-07-10 NOTE — Assessment & Plan Note (Addendum)
Suspect generalized itching is due to  allergies. The rash is only localized in the left arm and the itchiness is generalized. Will treat with Claritin and prednisone, if she is not better she certainly will need further evaluation.

## 2010-07-10 NOTE — Progress Notes (Signed)
  Subjective:    Patient ID: Kelli Evans, female    DOB: 07/09/1965, 45 y.o.   MRN: [id number]  HPI few days ago developed classic allergy symptoms (runny nose, cough, sore throat) but also develop a generalized itching. She started to avoid outdoors and started taking Claritin; she is feeling better except for the generalized itching. Today, she developed a rash at the inner aspect of the left arm  Past Medical History  Diagnosis Date  . GERD (gastroesophageal reflux disease)   . Hyperlipidemia   . Hypothyroidism      Review of Systems No smoker Not taking any new medications Denies any contact with poison ivy No nausea or vomiting No fever or joint aches    Objective:   Physical Exam  Constitutional: She appears well-developed and well-nourished. No distress.  HENT:  Mouth/Throat: No oropharyngeal exudate.  Eyes: Conjunctivae are normal. No scleral icterus.  Neck: Neck supple.  Cardiovascular: Normal rate, regular rhythm and normal heart sounds.   Pulmonary/Chest: Effort normal and breath sounds normal. No respiratory distress. She has no wheezes. She has no rales.  Skin:       Few, minute, round, macular lesion in the inner aspect of the left arm. No other skin lesions or rashes          Assessment & Plan:

## 2010-07-10 NOTE — Patient Instructions (Signed)
Prednisone as RX claritin 10 mg once a day every day until allergy season ends Call if rash extends, you have fever or if no better in few day

## 2010-07-12 ENCOUNTER — Telehealth: Payer: Self-pay | Admitting: *Deleted

## 2010-07-12 NOTE — Telephone Encounter (Signed)
Message left for patient to return my call.  

## 2010-07-12 NOTE — Telephone Encounter (Signed)
Spoke w/ pt says she is doing much better

## 2010-07-12 NOTE — Telephone Encounter (Signed)
Message copied by Army Fossa on Wed Jul 12, 2010 10:12 AM ------      Message from: Willow Ora      Created: Mon Jul 10, 2010  5:48 PM       Please check on her, better

## 2010-09-01 ENCOUNTER — Other Ambulatory Visit: Payer: Self-pay | Admitting: Family Medicine

## 2010-10-13 ENCOUNTER — Other Ambulatory Visit: Payer: Self-pay | Admitting: Family Medicine

## 2011-03-26 ENCOUNTER — Other Ambulatory Visit: Payer: Self-pay | Admitting: Family Medicine

## 2011-04-18 ENCOUNTER — Encounter: Payer: BC Managed Care – PPO | Admitting: Family Medicine

## 2011-07-30 ENCOUNTER — Ambulatory Visit (INDEPENDENT_AMBULATORY_CARE_PROVIDER_SITE_OTHER): Payer: BC Managed Care – PPO | Admitting: Family Medicine

## 2011-07-30 ENCOUNTER — Other Ambulatory Visit: Payer: Self-pay | Admitting: Family Medicine

## 2011-07-30 ENCOUNTER — Encounter: Payer: Self-pay | Admitting: Family Medicine

## 2011-07-30 VITALS — BP 120/68 | HR 76 | Temp 98.1°F | Resp 20 | Ht 66.5 in | Wt 215.0 lb

## 2011-07-30 DIAGNOSIS — F329 Major depressive disorder, single episode, unspecified: Secondary | ICD-10-CM

## 2011-07-30 DIAGNOSIS — Z1231 Encounter for screening mammogram for malignant neoplasm of breast: Secondary | ICD-10-CM

## 2011-07-30 DIAGNOSIS — K219 Gastro-esophageal reflux disease without esophagitis: Secondary | ICD-10-CM

## 2011-07-30 DIAGNOSIS — F410 Panic disorder [episodic paroxysmal anxiety] without agoraphobia: Secondary | ICD-10-CM

## 2011-07-30 DIAGNOSIS — R079 Chest pain, unspecified: Secondary | ICD-10-CM

## 2011-07-30 LAB — BASIC METABOLIC PANEL
CO2: 26 mEq/L (ref 19–32)
Calcium: 9 mg/dL (ref 8.4–10.5)
Creatinine, Ser: 0.7 mg/dL (ref 0.4–1.2)
Glucose, Bld: 94 mg/dL (ref 70–99)

## 2011-07-30 LAB — CBC WITH DIFFERENTIAL/PLATELET
Basophils Absolute: 0.1 10*3/uL (ref 0.0–0.1)
Eosinophils Absolute: 0.3 10*3/uL (ref 0.0–0.7)
Lymphocytes Relative: 20.6 % (ref 12.0–46.0)
MCHC: 33 g/dL (ref 30.0–36.0)
MCV: 83.8 fl (ref 78.0–100.0)
Monocytes Absolute: 0.4 10*3/uL (ref 0.1–1.0)
Neutro Abs: 5.9 10*3/uL (ref 1.4–7.7)
Neutrophils Relative %: 69.9 % (ref 43.0–77.0)
RDW: 13.4 % (ref 11.5–14.6)

## 2011-07-30 LAB — T4, FREE: Free T4: 0.88 ng/dL (ref 0.60–1.60)

## 2011-07-30 LAB — HEPATIC FUNCTION PANEL
Albumin: 3.9 g/dL (ref 3.5–5.2)
Alkaline Phosphatase: 55 U/L (ref 39–117)
Bilirubin, Direct: 0 mg/dL (ref 0.0–0.3)

## 2011-07-30 LAB — T3, FREE: T3, Free: 2.5 pg/mL (ref 2.3–4.2)

## 2011-07-30 LAB — VITAMIN B12: Vitamin B-12: 710 pg/mL (ref 211–911)

## 2011-07-30 MED ORDER — ALPRAZOLAM 0.25 MG PO TABS: ORAL_TABLET | ORAL | Status: DC

## 2011-07-30 MED ORDER — OMEPRAZOLE 20 MG PO CPDR: 20.0000 mg | DELAYED_RELEASE_CAPSULE | Freq: Every day | ORAL | Status: DC

## 2011-07-30 MED ORDER — VENLAFAXINE HCL ER 37.5 MG PO CP24: ORAL_CAPSULE | ORAL | Status: DC
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
## 2011-07-30 NOTE — Patient Instructions (Signed)

## 2011-08-08 ENCOUNTER — Ambulatory Visit
Admission: RE | Admit: 2011-08-08 | Discharge: 2011-08-08 | Disposition: A | Discharge status: Home / Self Care | Payer: BC Managed Care – PPO | Source: Ambulatory Visit | Attending: Family Medicine | Admitting: Family Medicine

## 2011-08-08 DIAGNOSIS — Z1231 Encounter for screening mammogram for malignant neoplasm of breast: Secondary | ICD-10-CM

## 2011-08-17 ENCOUNTER — Telehealth: Payer: Self-pay | Admitting: Family Medicine

## 2011-08-17 NOTE — Telephone Encounter (Signed)
Caller: Jennifer/Patient; PCP: Lelon Perla.; CB#: [phone]; ; ; Call regarding Cough/Congestion; Onset approx 08/10/11.  Now has face pain, "gunk in eye" that has gotten worse throughout the day.  Afebrile/tactile.  Cough producing yellow/ green sputum.  No current day appointments available.  Advised to call for appt [facility] on 08/18/11; UC if feels she needs to be seen sooner.  Caller agreed.

## 2011-08-17 NOTE — Telephone Encounter (Signed)
FYI to pt instructions given via CAN in previous note

## 2011-08-18 ENCOUNTER — Ambulatory Visit (INDEPENDENT_AMBULATORY_CARE_PROVIDER_SITE_OTHER): Payer: BC Managed Care – PPO | Admitting: Family Medicine

## 2011-08-18 ENCOUNTER — Encounter: Payer: Self-pay | Admitting: Family Medicine

## 2011-08-18 VITALS — BP 110/70 | HR 80 | Temp 98.5°F | Wt 208.1 lb

## 2011-08-18 DIAGNOSIS — H109 Unspecified conjunctivitis: Secondary | ICD-10-CM | POA: Insufficient documentation

## 2011-08-18 MED ORDER — TOBRAMYCIN-DEXAMETHASONE 0.3-0.1 % OP SUSP: 1.0000 [drp] | OPHTHALMIC | Status: AC

## 2011-08-18 NOTE — Patient Instructions (Signed)
Wash hands frequently/ try not to touch eye and wash linens/ pillowcases frequently  Do not use eye make up  Use cool compress when needed  Use the tobradex opthy solution as directed until better

## 2011-08-18 NOTE — Progress Notes (Signed)
Subjective:    Patient ID: Kelli Evans, female    DOB: [birth year], 46 y.o.   MRN: [id number]  HPI Here for ? Pink eye  Getting over a bad cold  Then some facial pain - thought poss sinus infx at first but now thinks she has pink eye  Some d/c and then swelling and redness of R eye - yesterday  This am, swollen shut and had to wipe away crust  Exp to child with pink eye at work   Is yellow d/c Burning and irritated  Vision is ok   Cold is getting better   No fever   Patient Active Problem List  Diagnoses  . HYPOTHYROIDISM  . HYPERLIPIDEMIA  . CARPAL TUNNEL SYNDROME, BILATERAL  . GERD  . Environmental allergies  . Conjunctivitis of right eye   Past Medical History  Diagnosis Date  . GERD (gastroesophageal reflux disease)   . Hyperlipidemia   . Hypothyroidism    No past surgical history on file. History  Substance Use Topics  . Smoking status: Never Smoker   . Smokeless tobacco: Not on file  . Alcohol Use: Yes   Family History  Problem Relation Age of Onset  . Hypertension Father   . Breast cancer Maternal Grandfather   . Breast cancer Mother   . Hypertension Father    No Known Allergies Current Outpatient Prescriptions on File Prior to Visit  Medication Sig Dispense Refill  . ALPRAZolam (XANAX) 0.25 MG tablet 1 po tid prn  30 tablet  0  . omeprazole (PRILOSEC) 20 MG capsule Take 1 capsule (20 mg total) by mouth daily.  43 capsule  11  . venlafaxine XR (EFFEXOR XR) 37.5 MG 24 hr capsule 1 po qd for 1 week then inc to 2 po qd  60 capsule  0      Review of Systems Review of Systems  Constitutional: Negative for fever, appetite change, fatigue and unexpected weight change.  Eyes: Negative for vision change/ pos for d/c and redness ENT pos for runny nose/ neg for ST or ear pain  Respiratory: Negative for cough and shortness of breath.   Cardiovascular: Negative for cp or palpitations    Gastrointestinal: Negative for nausea, diarrhea and constipation.    Genitourinary: Negative for urgency and frequency.  Skin: Negative for pallor or rash   Neurological: Negative for weakness, light-headedness, numbness and headaches.  Hematological: Negative for adenopathy. Does not bruise/bleed easily.  Psychiatric/Behavioral: Negative for dysphoric mood. The patient is not nervous/anxious.         Objective:   Physical Exam  Constitutional: She appears well-developed and well-nourished.  HENT:  Head: Normocephalic and atraumatic.  Right Ear: External ear normal.  Left Ear: External ear normal.  Mouth/Throat: Oropharynx is clear and moist. No oropharyngeal exudate.       Nares are injected and congested  No sinus tenderness  Eyes: EOM are normal. Pupils are equal, round, and reactive to light. Right eye exhibits discharge. No scleral icterus.       R conj is injected (non focally) with no evidence of trauma Some cloudy discharge noted Vision grossly normal  Neck: Normal range of motion. Neck supple. No JVD present.  Cardiovascular: Normal rate and regular rhythm.   Pulmonary/Chest: Effort normal and breath sounds normal. No respiratory distress. She has no wheezes.  Lymphadenopathy:    She has no cervical adenopathy.  Neurological: She is alert. No cranial nerve deficit.  Skin: Skin is warm and dry. No rash  noted. No erythema. No pallor.  Psychiatric: She has a normal mood and affect.          Assessment & Plan:

## 2011-08-18 NOTE — Assessment & Plan Note (Signed)
After exposure with child to it , also a cold  Due to colored drainage / marked injection- will cover with tobradex opthy susp every 4 hours Disc symptomatic care - see instructions on AVS  Disc hygiene  Update if not starting to improve over the weekend or worsening or vision change

## 2011-08-21 NOTE — Telephone Encounter (Signed)
Was she seen?  We can get her in today if necessary.

## 2011-08-21 NOTE — Telephone Encounter (Signed)
Patient was seen on 08/18/11 at [facility].      KP

## 2011-08-31 ENCOUNTER — Other Ambulatory Visit: Payer: Self-pay | Admitting: Family Medicine

## 2011-09-14 ENCOUNTER — Encounter: Payer: BC Managed Care – PPO | Admitting: Family Medicine

## 2011-10-29 ENCOUNTER — Ambulatory Visit (INDEPENDENT_AMBULATORY_CARE_PROVIDER_SITE_OTHER): Payer: BC Managed Care – PPO | Admitting: Family Medicine

## 2011-10-29 ENCOUNTER — Encounter: Payer: Self-pay | Admitting: Family Medicine

## 2011-10-29 ENCOUNTER — Other Ambulatory Visit (HOSPITAL_COMMUNITY)
Admission: RE | Admit: 2011-10-29 | Discharge: 2011-10-29 | Disposition: A | Discharge status: Home / Self Care | Payer: BC Managed Care – PPO | Source: Ambulatory Visit | Attending: Family Medicine | Admitting: Family Medicine

## 2011-10-29 VITALS — BP 118/64 | HR 72 | Temp 98.6°F | Ht 66.0 in | Wt 208.2 lb

## 2011-10-29 DIAGNOSIS — Z0000 Encounter for general adult medical examination without abnormal findings: Secondary | ICD-10-CM

## 2011-10-29 DIAGNOSIS — F419 Anxiety disorder, unspecified: Secondary | ICD-10-CM

## 2011-10-29 DIAGNOSIS — Z01419 Encounter for gynecological examination (general) (routine) without abnormal findings: Secondary | ICD-10-CM | POA: Insufficient documentation

## 2011-10-29 DIAGNOSIS — F411 Generalized anxiety disorder: Secondary | ICD-10-CM

## 2011-10-29 DIAGNOSIS — F418 Other specified anxiety disorders: Secondary | ICD-10-CM | POA: Insufficient documentation

## 2011-10-29 DIAGNOSIS — E039 Hypothyroidism, unspecified: Secondary | ICD-10-CM

## 2011-10-29 DIAGNOSIS — E785 Hyperlipidemia, unspecified: Secondary | ICD-10-CM

## 2011-10-29 DIAGNOSIS — Z124 Encounter for screening for malignant neoplasm of cervix: Secondary | ICD-10-CM

## 2011-10-29 LAB — CBC WITH DIFFERENTIAL/PLATELET
Basophils Relative: 0.8 % (ref 0.0–3.0)
Eosinophils Relative: 0.3 % (ref 0.0–5.0)
Lymphocytes Relative: 21.8 % (ref 12.0–46.0)
MCV: 84.6 fl (ref 78.0–100.0)
Monocytes Relative: 6 % (ref 3.0–12.0)
Neutrophils Relative %: 71.1 % (ref 43.0–77.0)
Platelets: 252 10*3/uL (ref 150.0–400.0)
RBC: 4.61 Mil/uL (ref 3.87–5.11)
WBC: 6.8 10*3/uL (ref 4.5–10.5)

## 2011-10-29 LAB — HEPATIC FUNCTION PANEL
ALT: 20 U/L (ref 0–35)
AST: 19 U/L (ref 0–37)
Alkaline Phosphatase: 62 U/L (ref 39–117)
Bilirubin, Direct: 0 mg/dL (ref 0.0–0.3)
Total Bilirubin: 0.8 mg/dL (ref 0.3–1.2)
Total Protein: 7.5 g/dL (ref 6.0–8.3)

## 2011-10-29 LAB — LIPID PANEL
Total CHOL/HDL Ratio: 4
Triglycerides: 63 mg/dL (ref 0.0–149.0)

## 2011-10-29 LAB — BASIC METABOLIC PANEL
BUN: 9 mg/dL (ref 6–23)
Calcium: 9.1 mg/dL (ref 8.4–10.5)
Chloride: 105 mEq/L (ref 96–112)
Creatinine, Ser: 0.6 mg/dL (ref 0.4–1.2)
GFR: 119.11 mL/min (ref >60.00)

## 2011-10-29 LAB — POCT URINALYSIS DIPSTICK
Bilirubin, UA: NEGATIVE
Ketones, UA: NEGATIVE
Leukocytes, UA: NEGATIVE
Nitrite, UA: NEGATIVE
pH, UA: 6

## 2011-10-29 LAB — TSH: TSH: 2.55 u[IU]/mL (ref 0.35–5.50)

## 2011-10-29 MED ORDER — VENLAFAXINE HCL ER 75 MG PO CP24: 75.0000 mg | ORAL_CAPSULE | Freq: Every day | ORAL | Status: DC

## 2011-10-29 NOTE — Assessment & Plan Note (Signed)
Refill effexor con't xanax prn

## 2011-10-29 NOTE — Assessment & Plan Note (Addendum)
Check labs 

## 2011-10-29 NOTE — Assessment & Plan Note (Signed)
con't meds  Check labs 

## 2011-10-29 NOTE — Patient Instructions (Signed)
Preventive Care for Adults, Female A healthy lifestyle and preventive care can promote health and wellness. Preventive health guidelines for women include the following key practices.  A routine yearly physical is a good way to check with your caregiver about your health and preventive screening. It is a chance to share any concerns and updates on your health, and to receive a thorough exam.   Visit your dentist for a routine exam and preventive care every 6 months. Brush your teeth twice a day and floss once a day. Good oral hygiene prevents tooth decay and gum disease.   The frequency of eye exams is based on your age, health, family medical history, use of contact lenses, and other factors. Follow your caregiver's recommendations for frequency of eye exams.   Eat a healthy diet. Foods like vegetables, fruits, whole grains, low-fat dairy products, and lean protein foods contain the nutrients you need without too many calories. Decrease your intake of foods high in solid fats, added sugars, and salt. Eat the right amount of calories for you.Get information about a proper diet from your caregiver, if necessary.   Regular physical exercise is one of the most important things you can do for your health. Most adults should get at least 150 minutes of moderate-intensity exercise (any activity that increases your heart rate and causes you to sweat) each week. In addition, most adults need muscle-strengthening exercises on 2 or more days a week.   Maintain a healthy weight. The body mass index (BMI) is a screening tool to identify possible weight problems. It provides an estimate of body fat based on height and weight. Your caregiver can help determine your BMI, and can help you achieve or maintain a healthy weight.For adults 20 years and older:   A BMI below 18.5 is considered underweight.   A BMI of 18.5 to 24.9 is normal.   A BMI of 25 to 29.9 is considered overweight.   A BMI of 30 and above is  considered obese.   Maintain normal blood lipids and cholesterol levels by exercising and minimizing your intake of saturated fat. Eat a balanced diet with plenty of fruit and vegetables. Blood tests for lipids and cholesterol should begin at age 20 and be repeated every 5 years. If your lipid or cholesterol levels are high, you are over 50, or you are at high risk for heart disease, you may need your cholesterol levels checked more frequently.Ongoing high lipid and cholesterol levels should be treated with medicines if diet and exercise are not effective.   If you smoke, find out from your caregiver how to quit. If you do not use tobacco, do not start.   If you are pregnant, do not drink alcohol. If you are breastfeeding, be very cautious about drinking alcohol. If you are not pregnant and choose to drink alcohol, do not exceed 1 drink per day. One drink is considered to be 12 ounces (355 mL) of beer, 5 ounces (148 mL) of wine, or 1.5 ounces (44 mL) of liquor.   Avoid use of street drugs. Do not share needles with anyone. Ask for help if you need support or instructions about stopping the use of drugs.   High blood pressure causes heart disease and increases the risk of stroke. Your blood pressure should be checked at least every 1 to 2 years. Ongoing high blood pressure should be treated with medicines if weight loss and exercise are not effective.   If you are 55 to 46   years old, ask your caregiver if you should take aspirin to prevent strokes.   Diabetes screening involves taking a blood sample to check your fasting blood sugar level. This should be done once every 3 years, after age 45, if you are within normal weight and without risk factors for diabetes. Testing should be considered at a younger age or be carried out more frequently if you are overweight and have at least 1 risk factor for diabetes.   Breast cancer screening is essential preventive care for women. You should practice "breast  self-awareness." This means understanding the normal appearance and feel of your breasts and may include breast self-examination. Any changes detected, no matter how small, should be reported to a caregiver. Women in their 20s and 30s should have a clinical breast exam (CBE) by a caregiver as part of a regular health exam every 1 to 3 years. After age 40, women should have a CBE every year. Starting at age 40, women should consider having a mammography (breast X-ray test) every year. Women who have a family history of breast cancer should talk to their caregiver about genetic screening. Women at a high risk of breast cancer should talk to their caregivers about having magnetic resonance imaging (MRI) and a mammography every year.   The Pap test is a screening test for cervical cancer. A Pap test can show cell changes on the cervix that might become cervical cancer if left untreated. A Pap test is a procedure in which cells are obtained and examined from the lower end of the uterus (cervix).   Women should have a Pap test starting at age 21.   Between ages 21 and 29, Pap tests should be repeated every 2 years.   Beginning at age 30, you should have a Pap test every 3 years as long as the past 3 Pap tests have been normal.   Some women have medical problems that increase the chance of getting cervical cancer. Talk to your caregiver about these problems. It is especially important to talk to your caregiver if a new problem develops soon after your last Pap test. In these cases, your caregiver may recommend more frequent screening and Pap tests.   The above recommendations are the same for women who have or have not gotten the vaccine for human papillomavirus (HPV).   If you had a hysterectomy for a problem that was not cancer or a condition that could lead to cancer, then you no longer need Pap tests. Even if you no longer need a Pap test, a regular exam is a good idea to make sure no other problems are  starting.   If you are between ages 65 and 70, and you have had normal Pap tests going back 10 years, you no longer need Pap tests. Even if you no longer need a Pap test, a regular exam is a good idea to make sure no other problems are starting.   If you have had past treatment for cervical cancer or a condition that could lead to cancer, you need Pap tests and screening for cancer for at least 20 years after your treatment.   If Pap tests have been discontinued, risk factors (such as a new sexual partner) need to be reassessed to determine if screening should be resumed.   The HPV test is an additional test that may be used for cervical cancer screening. The HPV test looks for the virus that can cause the cell changes on the cervix.   The cells collected during the Pap test can be tested for HPV. The HPV test could be used to screen women aged 30 years and older, and should be used in women of any age who have unclear Pap test results. After the age of 30, women should have HPV testing at the same frequency as a Pap test.   Colorectal cancer can be detected and often prevented. Most routine colorectal cancer screening begins at the age of 50 and continues through age 75. However, your caregiver may recommend screening at an earlier age if you have risk factors for colon cancer. On a yearly basis, your caregiver may provide home test kits to check for hidden blood in the stool. Use of a small camera at the end of a tube, to directly examine the colon (sigmoidoscopy or colonoscopy), can detect the earliest forms of colorectal cancer. Talk to your caregiver about this at age 50, when routine screening begins. Direct examination of the colon should be repeated every 5 to 10 years through age 75, unless early forms of pre-cancerous polyps or small growths are found.   Hepatitis C blood testing is recommended for all people born from 1945 through 1965 and any individual with known risks for hepatitis C.    Practice safe sex. Use condoms and avoid high-risk sexual practices to reduce the spread of sexually transmitted infections (STIs). STIs include gonorrhea, chlamydia, syphilis, trichomonas, herpes, HPV, and human immunodeficiency virus (HIV). Herpes, HIV, and HPV are viral illnesses that have no cure. They can result in disability, cancer, and death. Sexually active women aged 25 and younger should be checked for chlamydia. Older women with new or multiple partners should also be tested for chlamydia. Testing for other STIs is recommended if you are sexually active and at increased risk.   Osteoporosis is a disease in which the bones lose minerals and strength with aging. This can result in serious bone fractures. The risk of osteoporosis can be identified using a bone density scan. Women ages 65 and over and women at risk for fractures or osteoporosis should discuss screening with their caregivers. Ask your caregiver whether you should take a calcium supplement or vitamin D to reduce the rate of osteoporosis.   Menopause can be associated with physical symptoms and risks. Hormone replacement therapy is available to decrease symptoms and risks. You should talk to your caregiver about whether hormone replacement therapy is right for you.   Use sunscreen with sun protection factor (SPF) of 30 or more. Apply sunscreen liberally and repeatedly throughout the day. You should seek shade when your shadow is shorter than you. Protect yourself by wearing long sleeves, pants, a wide-brimmed hat, and sunglasses year round, whenever you are outdoors.   Once a month, do a whole body skin exam, using a mirror to look at the skin on your back. Notify your caregiver of new moles, moles that have irregular borders, moles that are larger than a pencil eraser, or moles that have changed in shape or color.   Stay current with required immunizations.   Influenza. You need a dose every fall (or winter). The composition of  the flu vaccine changes each year, so being vaccinated once is not enough.   Pneumococcal polysaccharide. You need 1 to 2 doses if you smoke cigarettes or if you have certain chronic medical conditions. You need 1 dose at age 65 (or older) if you have never been vaccinated.   Tetanus, diphtheria, pertussis (Tdap, Td). Get 1 dose of   Tdap vaccine if you are younger than age 65, are over 65 and have contact with an infant, are a healthcare worker, are pregnant, or simply want to be protected from whooping cough. After that, you need a Td booster dose every 10 years. Consult your caregiver if you have not had at least 3 tetanus and diphtheria-containing shots sometime in your life or have a deep or dirty wound.   HPV. You need this vaccine if you are a woman age 26 or younger. The vaccine is given in 3 doses over 6 months.   Measles, mumps, rubella (MMR). You need at least 1 dose of MMR if you were born in 1957 or later. You may also need a second dose.   Meningococcal. If you are age 19 to 21 and a first-year college student living in a residence hall, or have one of several medical conditions, you need to get vaccinated against meningococcal disease. You may also need additional booster doses.   Zoster (shingles). If you are age 60 or older, you should get this vaccine.   Varicella (chickenpox). If you have never had chickenpox or you were vaccinated but received only 1 dose, talk to your caregiver to find out if you need this vaccine.   Hepatitis A. You need this vaccine if you have a specific risk factor for hepatitis A virus infection or you simply wish to be protected from this disease. The vaccine is usually given as 2 doses, 6 to 18 months apart.   Hepatitis B. You need this vaccine if you have a specific risk factor for hepatitis B virus infection or you simply wish to be protected from this disease. The vaccine is given in 3 doses, usually over 6 months.  Preventive Services /  Frequency Ages 19 to 39  Blood pressure check.** / Every 1 to 2 years.   Lipid and cholesterol check.** / Every 5 years beginning at age 20.   Clinical breast exam.** / Every 3 years for women in their 20s and 30s.   Pap test.** / Every 2 years from ages 21 through 29. Every 3 years starting at age 30 through age 65 or 70 with a history of 3 consecutive normal Pap tests.   HPV screening.** / Every 3 years from ages 30 through ages 65 to 70 with a history of 3 consecutive normal Pap tests.   Hepatitis C blood test.** / For any individual with known risks for hepatitis C.   Skin self-exam. / Monthly.   Influenza immunization.** / Every year.   Pneumococcal polysaccharide immunization.** / 1 to 2 doses if you smoke cigarettes or if you have certain chronic medical conditions.   Tetanus, diphtheria, pertussis (Tdap, Td) immunization. / A one-time dose of Tdap vaccine. After that, you need a Td booster dose every 10 years.   HPV immunization. / 3 doses over 6 months, if you are 26 and younger.   Measles, mumps, rubella (MMR) immunization. / You need at least 1 dose of MMR if you were born in 1957 or later. You may also need a second dose.   Meningococcal immunization. / 1 dose if you are age 19 to 21 and a first-year college student living in a residence hall, or have one of several medical conditions, you need to get vaccinated against meningococcal disease. You may also need additional booster doses.   Varicella immunization.** / Consult your caregiver.   Hepatitis A immunization.** / Consult your caregiver. 2 doses, 6 to 18 months   apart.   Hepatitis B immunization.** / Consult your caregiver. 3 doses usually over 6 months.  Ages 40 to 64  Blood pressure check.** / Every 1 to 2 years.   Lipid and cholesterol check.** / Every 5 years beginning at age 20.   Clinical breast exam.** / Every year after age 40.   Mammogram.** / Every year beginning at age 40 and continuing for as  long as you are in good health. Consult with your caregiver.   Pap test.** / Every 3 years starting at age 30 through age 65 or 70 with a history of 3 consecutive normal Pap tests.   HPV screening.** / Every 3 years from ages 30 through ages 65 to 70 with a history of 3 consecutive normal Pap tests.   Fecal occult blood test (FOBT) of stool. / Every year beginning at age 50 and continuing until age 75. You may not need to do this test if you get a colonoscopy every 10 years.   Flexible sigmoidoscopy or colonoscopy.** / Every 5 years for a flexible sigmoidoscopy or every 10 years for a colonoscopy beginning at age 50 and continuing until age 75.   Hepatitis C blood test.** / For all people born from 1945 through 1965 and any individual with known risks for hepatitis C.   Skin self-exam. / Monthly.   Influenza immunization.** / Every year.   Pneumococcal polysaccharide immunization.** / 1 to 2 doses if you smoke cigarettes or if you have certain chronic medical conditions.   Tetanus, diphtheria, pertussis (Tdap, Td) immunization.** / A one-time dose of Tdap vaccine. After that, you need a Td booster dose every 10 years.   Measles, mumps, rubella (MMR) immunization. / You need at least 1 dose of MMR if you were born in 1957 or later. You may also need a second dose.   Varicella immunization.** / Consult your caregiver.   Meningococcal immunization.** / Consult your caregiver.   Hepatitis A immunization.** / Consult your caregiver. 2 doses, 6 to 18 months apart.   Hepatitis B immunization.** / Consult your caregiver. 3 doses, usually over 6 months.  Ages 65 and over  Blood pressure check.** / Every 1 to 2 years.   Lipid and cholesterol check.** / Every 5 years beginning at age 20.   Clinical breast exam.** / Every year after age 40.   Mammogram.** / Every year beginning at age 40 and continuing for as long as you are in good health. Consult with your caregiver.   Pap test.** /  Every 3 years starting at age 30 through age 65 or 70 with a 3 consecutive normal Pap tests. Testing can be stopped between 65 and 70 with 3 consecutive normal Pap tests and no abnormal Pap or HPV tests in the past 10 years.   HPV screening.** / Every 3 years from ages 30 through ages 65 or 70 with a history of 3 consecutive normal Pap tests. Testing can be stopped between 65 and 70 with 3 consecutive normal Pap tests and no abnormal Pap or HPV tests in the past 10 years.   Fecal occult blood test (FOBT) of stool. / Every year beginning at age 50 and continuing until age 75. You may not need to do this test if you get a colonoscopy every 10 years.   Flexible sigmoidoscopy or colonoscopy.** / Every 5 years for a flexible sigmoidoscopy or every 10 years for a colonoscopy beginning at age 50 and continuing until age 75.   Hepatitis   C blood test.** / For all people born from 1945 through 1965 and any individual with known risks for hepatitis C.   Osteoporosis screening.** / A one-time screening for women ages 65 and over and women at risk for fractures or osteoporosis.   Skin self-exam. / Monthly.   Influenza immunization.** / Every year.   Pneumococcal polysaccharide immunization.** / 1 dose at age 65 (or older) if you have never been vaccinated.   Tetanus, diphtheria, pertussis (Tdap, Td) immunization. / A one-time dose of Tdap vaccine if you are over 65 and have contact with an infant, are a healthcare worker, or simply want to be protected from whooping cough. After that, you need a Td booster dose every 10 years.   Varicella immunization.** / Consult your caregiver.   Meningococcal immunization.** / Consult your caregiver.   Hepatitis A immunization.** / Consult your caregiver. 2 doses, 6 to 18 months apart.   Hepatitis B immunization.** / Check with your caregiver. 3 doses, usually over 6 months.  ** Family history and personal history of risk and conditions may change your caregiver's  recommendations. Document Released: 05/08/2001 Document Revised: 03/01/2011 Document Reviewed: 08/07/2010 [employer] Patient Information 2012 [employer]. 

## 2011-10-29 NOTE — Progress Notes (Signed)
Subjective:     Kelli Evans is a 46 y.o. female and is here for a comprehensive physical exam. The patient reports no problems.  History   Social History  . Marital Status: Single    Spouse Name: N/A    Number of Children: N/A  . Years of Education: N/A   Occupational History  . Not on file.   Social History Main Topics  . Smoking status: Never Smoker   . Smokeless tobacco: Not on file  . Alcohol Use: Yes  . Drug Use: No  . Sexually Active: Not on file   Other Topics Concern  . Not on file   Social History Narrative   Exercise--  Walks dog 4-5 x a week---2-3 miles   Health Maintenance  Topic Date Due  . Influenza Vaccine  12/25/2011  . Mammogram  08/07/2012  . Pap Smear  10/29/2014  . Tetanus/tdap  10/04/2019    The following portions of the patient's history were reviewed and updated as appropriate: allergies, current medications, past family history, past medical history, past social history, past surgical history and problem list.  Review of Systems Review of Systems  Constitutional: Negative for activity change, appetite change and fatigue.  HENT: Negative for hearing loss, congestion, tinnitus and ear discharge.  dentist- q2y Eyes: Negative for visual disturbance (see optho q1y -- vision corrected to 20/20 with glasses).  Respiratory: Negative for cough, chest tightness and shortness of breath.   Cardiovascular: Negative for chest pain, palpitations and leg swelling.  Gastrointestinal: Negative for abdominal pain, diarrhea, constipation and abdominal distention.  Genitourinary: Negative for urgency, frequency, decreased urine volume and difficulty urinating.  Musculoskeletal: Negative for back pain, arthralgias and gait problem.  Skin: Negative for color change, pallor and rash.  Neurological: Negative for dizziness, light-headedness, numbness and headaches.  Hematological: Negative for adenopathy. Does not bruise/bleed easily.  Psychiatric/Behavioral: Negative  for suicidal ideas, confusion, sleep disturbance, self-injury, dysphoric mood, decreased concentration and agitation.       Objective:    BP 118/64  Pulse 72  Temp 98.6 F (37 C) (Oral)  Ht 5\' 6"  (1.676 m)  Wt 208 lb 3.2 oz (94.439 kg)  BMI 33.60 kg/m2  SpO2 97%  LMP 10/11/2011 General appearance: alert, cooperative, appears stated age and no distress Head: Normocephalic, without obvious abnormality, atraumatic Eyes: conjunctivae/corneas clear. PERRL, EOM's intact. Fundi benign. Ears: normal TM's and external ear canals both ears Nose: Nares normal. Septum midline. Mucosa normal. No drainage or sinus tenderness. Throat: lips, mucosa, and tongue normal; teeth and gums normal Neck: no adenopathy, no carotid bruit, no JVD, supple, symmetrical, trachea midline and thyroid not enlarged, symmetric, no tenderness/mass/nodules Back: symmetric, no curvature. ROM normal. No CVA tenderness. Lungs: clear to auscultation bilaterally Breasts: normal appearance, no masses or tenderness Heart: regular rate and rhythm, S1, S2 normal, no murmur, click, rub or gallop Abdomen: soft, non-tender; bowel sounds normal; no masses,  no organomegaly Pelvic: cervix normal in appearance, external genitalia normal, no adnexal masses or tenderness, no cervical motion tenderness, rectovaginal septum normal, uterus normal size, shape, and consistency and vagina normal without discharge--pap done Extremities: extremities normal, atraumatic, no cyanosis or edema Pulses: 2+ and symmetric Skin: Skin color, texture, turgor normal. No rashes or lesions Lymph nodes: Cervical, supraclavicular, and axillary nodes normal. Neurologic: Alert and oriented X 3, normal strength and tone. Normal symmetric reflexes. Normal coordination and gait psych-- no depression, anxiety    Assessment:    Healthy female exam.  Plan:    check labs ghm utd See After Visit Summary for Counseling Recommendations

## 2012-02-18 ENCOUNTER — Other Ambulatory Visit: Payer: Self-pay | Admitting: Family Medicine

## 2012-02-18 DIAGNOSIS — F410 Panic disorder [episodic paroxysmal anxiety] without agoraphobia: Secondary | ICD-10-CM

## 2012-02-18 MED ORDER — ALPRAZOLAM 0.25 MG PO TABS: ORAL_TABLET | ORAL | Status: DC

## 2012-02-18 NOTE — Telephone Encounter (Signed)
Refill x1   2 refills 

## 2012-02-18 NOTE — Telephone Encounter (Signed)
Please advise      KP 

## 2012-02-18 NOTE — Telephone Encounter (Signed)
refill ALPRAZolam (Tab) 0.25 MG 1 po tid prn last fill 5.6.13 last ov 8.5.13

## 2012-05-10 ENCOUNTER — Other Ambulatory Visit: Payer: Self-pay

## 2012-06-06 ENCOUNTER — Other Ambulatory Visit: Payer: Self-pay | Admitting: Family Medicine

## 2012-08-19 ENCOUNTER — Other Ambulatory Visit: Payer: Self-pay | Admitting: Family Medicine

## 2012-09-01 ENCOUNTER — Encounter: Payer: Self-pay | Admitting: Family Medicine

## 2012-09-01 ENCOUNTER — Ambulatory Visit (INDEPENDENT_AMBULATORY_CARE_PROVIDER_SITE_OTHER): Payer: BC Managed Care – PPO | Admitting: Family Medicine

## 2012-09-01 VITALS — BP 118/72 | HR 80 | Temp 99.3°F | Wt 216.0 lb

## 2012-09-01 DIAGNOSIS — F341 Dysthymic disorder: Secondary | ICD-10-CM

## 2012-09-01 DIAGNOSIS — F418 Other specified anxiety disorders: Secondary | ICD-10-CM

## 2012-09-01 DIAGNOSIS — F329 Major depressive disorder, single episode, unspecified: Secondary | ICD-10-CM

## 2012-09-01 MED ORDER — VENLAFAXINE HCL ER 150 MG PO CP24: 150.0000 mg | ORAL_CAPSULE | Freq: Every day | ORAL | Status: DC

## 2012-09-01 NOTE — Patient Instructions (Signed)

## 2012-09-01 NOTE — Progress Notes (Signed)
  Subjective:    Patient ID: Kelli Evans, female    DOB: Nov 20, 1965, 47 y.o.   MRN: [id number]  HPI Pt here f/u depression --- she is doing better with counseling and meds but still not as good as she thinks she could be. Pt is not suicidal.    Review of Systems As above    Objective:   Physical Exam  BP 118/72  Pulse 80  Temp(Src) 99.3 F (37.4 C) (Oral)  Wt 216 lb (97.977 kg)  BMI 34.88 kg/m2  SpO2 97% General appearance: alert, cooperative, appears stated age and no distress Neurologic: Grossly normal Psych--      Assessment & Plan:

## 2012-09-01 NOTE — Assessment & Plan Note (Signed)
Inc effexor to 150 mg daily F/u 4-6 weeks or sooner prn

## 2012-10-20 ENCOUNTER — Other Ambulatory Visit: Payer: Self-pay | Admitting: *Deleted

## 2012-10-20 MED ORDER — OMEPRAZOLE MAGNESIUM 20 MG PO TBEC: DELAYED_RELEASE_TABLET | ORAL | Status: DC

## 2012-10-20 NOTE — Telephone Encounter (Signed)
Rx was refilled for Prilosec OTC 40 mg 30 ct.

## 2012-10-31 ENCOUNTER — Other Ambulatory Visit (HOSPITAL_COMMUNITY)
Admission: RE | Admit: 2012-10-31 | Discharge: 2012-10-31 | Disposition: A | Discharge status: Home / Self Care | Payer: BC Managed Care – PPO | Source: Ambulatory Visit | Attending: Family Medicine | Admitting: Family Medicine

## 2012-10-31 ENCOUNTER — Encounter: Payer: Self-pay | Admitting: Family Medicine

## 2012-10-31 ENCOUNTER — Ambulatory Visit (INDEPENDENT_AMBULATORY_CARE_PROVIDER_SITE_OTHER): Payer: BC Managed Care – PPO | Admitting: Family Medicine

## 2012-10-31 VITALS — BP 118/76 | HR 86 | Temp 98.8°F | Ht 66.0 in | Wt 211.0 lb

## 2012-10-31 DIAGNOSIS — E669 Obesity, unspecified: Secondary | ICD-10-CM

## 2012-10-31 DIAGNOSIS — K219 Gastro-esophageal reflux disease without esophagitis: Secondary | ICD-10-CM

## 2012-10-31 DIAGNOSIS — E785 Hyperlipidemia, unspecified: Secondary | ICD-10-CM

## 2012-10-31 DIAGNOSIS — L708 Other acne: Secondary | ICD-10-CM

## 2012-10-31 DIAGNOSIS — Z0000 Encounter for general adult medical examination without abnormal findings: Secondary | ICD-10-CM

## 2012-10-31 DIAGNOSIS — Z1151 Encounter for screening for human papillomavirus (HPV): Secondary | ICD-10-CM | POA: Insufficient documentation

## 2012-10-31 DIAGNOSIS — Z01419 Encounter for gynecological examination (general) (routine) without abnormal findings: Secondary | ICD-10-CM | POA: Insufficient documentation

## 2012-10-31 DIAGNOSIS — E039 Hypothyroidism, unspecified: Secondary | ICD-10-CM

## 2012-10-31 DIAGNOSIS — L709 Acne, unspecified: Secondary | ICD-10-CM

## 2012-10-31 DIAGNOSIS — Z124 Encounter for screening for malignant neoplasm of cervix: Secondary | ICD-10-CM

## 2012-10-31 LAB — CBC WITH DIFFERENTIAL/PLATELET
Basophils Relative: 0.3 % (ref 0.0–3.0)
Eosinophils Relative: 0.1 % (ref 0.0–5.0)
HCT: 37.5 % (ref 36.0–46.0)
MCV: 81.9 fl (ref 78.0–100.0)
Monocytes Absolute: 0.6 10*3/uL (ref 0.1–1.0)
Monocytes Relative: 7.1 % (ref 3.0–12.0)
Neutrophils Relative %: 70.7 % (ref 43.0–77.0)
RBC: 4.58 Mil/uL (ref 3.87–5.11)
WBC: 7.9 10*3/uL (ref 4.5–10.5)

## 2012-10-31 LAB — LIPID PANEL
Total CHOL/HDL Ratio: 4
Triglycerides: 81 mg/dL (ref 0.0–149.0)

## 2012-10-31 LAB — HEPATIC FUNCTION PANEL
ALT: 17 U/L (ref 0–35)
AST: 14 U/L (ref 0–37)
Bilirubin, Direct: 0.1 mg/dL (ref 0.0–0.3)
Total Protein: 7.1 g/dL (ref 6.0–8.3)

## 2012-10-31 LAB — BASIC METABOLIC PANEL
Chloride: 101 mEq/L (ref 96–112)
GFR: 98.7 mL/min (ref >60.00)
Potassium: 3.6 mEq/L (ref 3.5–5.1)

## 2012-10-31 LAB — TSH: TSH: 2.57 u[IU]/mL (ref 0.35–5.50)

## 2012-10-31 LAB — LDL CHOLESTEROL, DIRECT: Direct LDL: 151.6 mg/dL

## 2012-10-31 MED ORDER — OMEPRAZOLE MAGNESIUM 20 MG PO TBEC: DELAYED_RELEASE_TABLET | ORAL | Status: DC

## 2012-10-31 MED ORDER — MINOCYCLINE HCL 100 MG PO CAPS: 100.0000 mg | ORAL_CAPSULE | Freq: Two times a day (BID) | ORAL | Status: DC

## 2012-10-31 NOTE — Patient Instructions (Addendum)
Preventive Care for Adults, Female A healthy lifestyle and preventive care can promote health and wellness. Preventive health guidelines for women include the following key practices.  A routine yearly physical is a good way to check with your caregiver about your health and preventive screening. It is a chance to share any concerns and updates on your health, and to receive a thorough exam.  Visit your dentist for a routine exam and preventive care every 6 months. Brush your teeth twice a day and floss once a day. Good oral hygiene prevents tooth decay and gum disease.  The frequency of eye exams is based on your age, health, family medical history, use of contact lenses, and other factors. Follow your caregiver's recommendations for frequency of eye exams.  Eat a healthy diet. Foods like vegetables, fruits, whole grains, low-fat dairy products, and lean protein foods contain the nutrients you need without too many calories. Decrease your intake of foods high in solid fats, added sugars, and salt. Eat the right amount of calories for you.Get information about a proper diet from your caregiver, if necessary.  Regular physical exercise is one of the most important things you can do for your health. Most adults should get at least 150 minutes of moderate-intensity exercise (any activity that increases your heart rate and causes you to sweat) each week. In addition, most adults need muscle-strengthening exercises on 2 or more days a week.  Maintain a healthy weight. The body mass index (BMI) is a screening tool to identify possible weight problems. It provides an estimate of body fat based on height and weight. Your caregiver can help determine your BMI, and can help you achieve or maintain a healthy weight.For adults 20 years and older:  A BMI below 18.5 is considered underweight.  A BMI of 18.5 to 24.9 is normal.  A BMI of 25 to 29.9 is considered overweight.  A BMI of 30 and above is  considered obese.  Maintain normal blood lipids and cholesterol levels by exercising and minimizing your intake of saturated fat. Eat a balanced diet with plenty of fruit and vegetables. Blood tests for lipids and cholesterol should begin at age 20 and be repeated every 5 years. If your lipid or cholesterol levels are high, you are over 50, or you are at high risk for heart disease, you may need your cholesterol levels checked more frequently.Ongoing high lipid and cholesterol levels should be treated with medicines if diet and exercise are not effective.  If you smoke, find out from your caregiver how to quit. If you do not use tobacco, do not start.  If you are pregnant, do not drink alcohol. If you are breastfeeding, be very cautious about drinking alcohol. If you are not pregnant and choose to drink alcohol, do not exceed 1 drink per day. One drink is considered to be 12 ounces (355 mL) of beer, 5 ounces (148 mL) of wine, or 1.5 ounces (44 mL) of liquor.  Avoid use of street drugs. Do not share needles with anyone. Ask for help if you need support or instructions about stopping the use of drugs.  High blood pressure causes heart disease and increases the risk of stroke. Your blood pressure should be checked at least every 1 to 2 years. Ongoing high blood pressure should be treated with medicines if weight loss and exercise are not effective.  If you are 55 to 47 years old, ask your caregiver if you should take aspirin to prevent strokes.  Diabetes   screening involves taking a blood sample to check your fasting blood sugar level. This should be done once every 3 years, after age 45, if you are within normal weight and without risk factors for diabetes. Testing should be considered at a younger age or be carried out more frequently if you are overweight and have at least 1 risk factor for diabetes.  Breast cancer screening is essential preventive care for women. You should practice "breast  self-awareness." This means understanding the normal appearance and feel of your breasts and may include breast self-examination. Any changes detected, no matter how small, should be reported to a caregiver. Women in their 20s and 30s should have a clinical breast exam (CBE) by a caregiver as part of a regular health exam every 1 to 3 years. After age 40, women should have a CBE every year. Starting at age 40, women should consider having a mammography (breast X-ray test) every year. Women who have a family history of breast cancer should talk to their caregiver about genetic screening. Women at a high risk of breast cancer should talk to their caregivers about having magnetic resonance imaging (MRI) and a mammography every year.  The Pap test is a screening test for cervical cancer. A Pap test can show cell changes on the cervix that might become cervical cancer if left untreated. A Pap test is a procedure in which cells are obtained and examined from the lower end of the uterus (cervix).  Women should have a Pap test starting at age 21.  Between ages 21 and 29, Pap tests should be repeated every 2 years.  Beginning at age 30, you should have a Pap test every 3 years as long as the past 3 Pap tests have been normal.  Some women have medical problems that increase the chance of getting cervical cancer. Talk to your caregiver about these problems. It is especially important to talk to your caregiver if a new problem develops soon after your last Pap test. In these cases, your caregiver may recommend more frequent screening and Pap tests.  The above recommendations are the same for women who have or have not gotten the vaccine for human papillomavirus (HPV).  If you had a hysterectomy for a problem that was not cancer or a condition that could lead to cancer, then you no longer need Pap tests. Even if you no longer need a Pap test, a regular exam is a good idea to make sure no other problems are  starting.  If you are between ages 65 and 70, and you have had normal Pap tests going back 10 years, you no longer need Pap tests. Even if you no longer need a Pap test, a regular exam is a good idea to make sure no other problems are starting.  If you have had past treatment for cervical cancer or a condition that could lead to cancer, you need Pap tests and screening for cancer for at least 20 years after your treatment.  If Pap tests have been discontinued, risk factors (such as a new sexual partner) need to be reassessed to determine if screening should be resumed.  The HPV test is an additional test that may be used for cervical cancer screening. The HPV test looks for the virus that can cause the cell changes on the cervix. The cells collected during the Pap test can be tested for HPV. The HPV test could be used to screen women aged 30 years and older, and should   be used in women of any age who have unclear Pap test results. After the age of 30, women should have HPV testing at the same frequency as a Pap test.  Colorectal cancer can be detected and often prevented. Most routine colorectal cancer screening begins at the age of 50 and continues through age 75. However, your caregiver may recommend screening at an earlier age if you have risk factors for colon cancer. On a yearly basis, your caregiver may provide home test kits to check for hidden blood in the stool. Use of a small camera at the end of a tube, to directly examine the colon (sigmoidoscopy or colonoscopy), can detect the earliest forms of colorectal cancer. Talk to your caregiver about this at age 50, when routine screening begins. Direct examination of the colon should be repeated every 5 to 10 years through age 75, unless early forms of pre-cancerous polyps or small growths are found.  Hepatitis C blood testing is recommended for all people born from 1945 through 1965 and any individual with known risks for hepatitis C.  Practice  safe sex. Use condoms and avoid high-risk sexual practices to reduce the spread of sexually transmitted infections (STIs). STIs include gonorrhea, chlamydia, syphilis, trichomonas, herpes, HPV, and human immunodeficiency virus (HIV). Herpes, HIV, and HPV are viral illnesses that have no cure. They can result in disability, cancer, and death. Sexually active women aged 25 and younger should be checked for chlamydia. Older women with new or multiple partners should also be tested for chlamydia. Testing for other STIs is recommended if you are sexually active and at increased risk.  Osteoporosis is a disease in which the bones lose minerals and strength with aging. This can result in serious bone fractures. The risk of osteoporosis can be identified using a bone density scan. Women ages 65 and over and women at risk for fractures or osteoporosis should discuss screening with their caregivers. Ask your caregiver whether you should take a calcium supplement or vitamin D to reduce the rate of osteoporosis.  Menopause can be associated with physical symptoms and risks. Hormone replacement therapy is available to decrease symptoms and risks. You should talk to your caregiver about whether hormone replacement therapy is right for you.  Use sunscreen with sun protection factor (SPF) of 30 or more. Apply sunscreen liberally and repeatedly throughout the day. You should seek shade when your shadow is shorter than you. Protect yourself by wearing long sleeves, pants, a wide-brimmed hat, and sunglasses year round, whenever you are outdoors.  Once a month, do a whole body skin exam, using a mirror to look at the skin on your back. Notify your caregiver of new moles, moles that have irregular borders, moles that are larger than a pencil eraser, or moles that have changed in shape or color.  Stay current with required immunizations.  Influenza. You need a dose every fall (or winter). The composition of the flu vaccine  changes each year, so being vaccinated once is not enough.  Pneumococcal polysaccharide. You need 1 to 2 doses if you smoke cigarettes or if you have certain chronic medical conditions. You need 1 dose at age 65 (or older) if you have never been vaccinated.  Tetanus, diphtheria, pertussis (Tdap, Td). Get 1 dose of Tdap vaccine if you are younger than age 65, are over 65 and have contact with an infant, are a healthcare worker, are pregnant, or simply want to be protected from whooping cough. After that, you need a Td   booster dose every 10 years. Consult your caregiver if you have not had at least 3 tetanus and diphtheria-containing shots sometime in your life or have a deep or dirty wound.  HPV. You need this vaccine if you are a woman age 26 or younger. The vaccine is given in 3 doses over 6 months.  Measles, mumps, rubella (MMR). You need at least 1 dose of MMR if you were born in 1957 or later. You may also need a second dose.  Meningococcal. If you are age 19 to 21 and a first-year college student living in a residence hall, or have one of several medical conditions, you need to get vaccinated against meningococcal disease. You may also need additional booster doses.  Zoster (shingles). If you are age 60 or older, you should get this vaccine.  Varicella (chickenpox). If you have never had chickenpox or you were vaccinated but received only 1 dose, talk to your caregiver to find out if you need this vaccine.  Hepatitis A. You need this vaccine if you have a specific risk factor for hepatitis A virus infection or you simply wish to be protected from this disease. The vaccine is usually given as 2 doses, 6 to 18 months apart.  Hepatitis B. You need this vaccine if you have a specific risk factor for hepatitis B virus infection or you simply wish to be protected from this disease. The vaccine is given in 3 doses, usually over 6 months. Preventive Services / Frequency Ages 19 to 39  Blood  pressure check.** / Every 1 to 2 years.  Lipid and cholesterol check.** / Every 5 years beginning at age 20.  Clinical breast exam.** / Every 3 years for women in their 20s and 30s.  Pap test.** / Every 2 years from ages 21 through 29. Every 3 years starting at age 30 through age 65 or 70 with a history of 3 consecutive normal Pap tests.  HPV screening.** / Every 3 years from ages 30 through ages 65 to 70 with a history of 3 consecutive normal Pap tests.  Hepatitis C blood test.** / For any individual with known risks for hepatitis C.  Skin self-exam. / Monthly.  Influenza immunization.** / Every year.  Pneumococcal polysaccharide immunization.** / 1 to 2 doses if you smoke cigarettes or if you have certain chronic medical conditions.  Tetanus, diphtheria, pertussis (Tdap, Td) immunization. / A one-time dose of Tdap vaccine. After that, you need a Td booster dose every 10 years.  HPV immunization. / 3 doses over 6 months, if you are 26 and younger.  Measles, mumps, rubella (MMR) immunization. / You need at least 1 dose of MMR if you were born in 1957 or later. You may also need a second dose.  Meningococcal immunization. / 1 dose if you are age 19 to 21 and a first-year college student living in a residence hall, or have one of several medical conditions, you need to get vaccinated against meningococcal disease. You may also need additional booster doses.  Varicella immunization.** / Consult your caregiver.  Hepatitis A immunization.** / Consult your caregiver. 2 doses, 6 to 18 months apart.  Hepatitis B immunization.** / Consult your caregiver. 3 doses usually over 6 months. Ages 40 to 64  Blood pressure check.** / Every 1 to 2 years.  Lipid and cholesterol check.** / Every 5 years beginning at age 20.  Clinical breast exam.** / Every year after age 40.  Mammogram.** / Every year beginning at age 40   and continuing for as long as you are in good health. Consult with your  caregiver.  Pap test.** / Every 3 years starting at age 30 through age 65 or 70 with a history of 3 consecutive normal Pap tests.  HPV screening.** / Every 3 years from ages 30 through ages 65 to 70 with a history of 3 consecutive normal Pap tests.  Fecal occult blood test (FOBT) of stool. / Every year beginning at age 50 and continuing until age 75. You may not need to do this test if you get a colonoscopy every 10 years.  Flexible sigmoidoscopy or colonoscopy.** / Every 5 years for a flexible sigmoidoscopy or every 10 years for a colonoscopy beginning at age 50 and continuing until age 75.  Hepatitis C blood test.** / For all people born from 1945 through 1965 and any individual with known risks for hepatitis C.  Skin self-exam. / Monthly.  Influenza immunization.** / Every year.  Pneumococcal polysaccharide immunization.** / 1 to 2 doses if you smoke cigarettes or if you have certain chronic medical conditions.  Tetanus, diphtheria, pertussis (Tdap, Td) immunization.** / A one-time dose of Tdap vaccine. After that, you need a Td booster dose every 10 years.  Measles, mumps, rubella (MMR) immunization. / You need at least 1 dose of MMR if you were born in 1957 or later. You may also need a second dose.  Varicella immunization.** / Consult your caregiver.  Meningococcal immunization.** / Consult your caregiver.  Hepatitis A immunization.** / Consult your caregiver. 2 doses, 6 to 18 months apart.  Hepatitis B immunization.** / Consult your caregiver. 3 doses, usually over 6 months. Ages 65 and over  Blood pressure check.** / Every 1 to 2 years.  Lipid and cholesterol check.** / Every 5 years beginning at age 20.  Clinical breast exam.** / Every year after age 40.  Mammogram.** / Every year beginning at age 40 and continuing for as long as you are in good health. Consult with your caregiver.  Pap test.** / Every 3 years starting at age 30 through age 65 or 70 with a 3  consecutive normal Pap tests. Testing can be stopped between 65 and 70 with 3 consecutive normal Pap tests and no abnormal Pap or HPV tests in the past 10 years.  HPV screening.** / Every 3 years from ages 30 through ages 65 or 70 with a history of 3 consecutive normal Pap tests. Testing can be stopped between 65 and 70 with 3 consecutive normal Pap tests and no abnormal Pap or HPV tests in the past 10 years.  Fecal occult blood test (FOBT) of stool. / Every year beginning at age 50 and continuing until age 75. You may not need to do this test if you get a colonoscopy every 10 years.  Flexible sigmoidoscopy or colonoscopy.** / Every 5 years for a flexible sigmoidoscopy or every 10 years for a colonoscopy beginning at age 50 and continuing until age 75.  Hepatitis C blood test.** / For all people born from 1945 through 1965 and any individual with known risks for hepatitis C.  Osteoporosis screening.** / A one-time screening for women ages 65 and over and women at risk for fractures or osteoporosis.  Skin self-exam. / Monthly.  Influenza immunization.** / Every year.  Pneumococcal polysaccharide immunization.** / 1 dose at age 65 (or older) if you have never been vaccinated.  Tetanus, diphtheria, pertussis (Tdap, Td) immunization. / A one-time dose of Tdap vaccine if you are over   65 and have contact with an infant, are a healthcare worker, or simply want to be protected from whooping cough. After that, you need a Td booster dose every 10 years.  Varicella immunization.** / Consult your caregiver.  Meningococcal immunization.** / Consult your caregiver.  Hepatitis A immunization.** / Consult your caregiver. 2 doses, 6 to 18 months apart.  Hepatitis B immunization.** / Check with your caregiver. 3 doses, usually over 6 months. ** Family history and personal history of risk and conditions may change your caregiver's recommendations. Document Released: 05/08/2001 Document Revised: 06/04/2011  Document Reviewed: 08/07/2010 [employer] Patient Information 2014 [employer].  

## 2012-10-31 NOTE — Progress Notes (Signed)
Subjective:     Kelli Evans is a 47 y.o. female and is here for a comprehensive physical exam. The patient reports no problems.  History   Social History  . Marital Status: Single    Spouse Name: N/A    Number of Children: N/A  . Years of Education: N/A   Occupational History  . Not on file.   Social History Main Topics  . Smoking status: Never Smoker   . Smokeless tobacco: Not on file  . Alcohol Use: Yes  . Drug Use: No  . Sexually Active: Not on file   Other Topics Concern  . Not on file   Social History Narrative   Exercise--  Walks dog 4-5 x a week---2-3 miles, yoga   Health Maintenance  Topic Date Due  . Mammogram  08/07/2012  . Influenza Vaccine  11/24/2012  . Pap Smear  11/01/2015  . Tetanus/tdap  10/04/2019    The following portions of the patient's history were reviewed and updated as appropriate:  She  has a past medical history of GERD (gastroesophageal reflux disease); Hyperlipidemia; and Hypothyroidism. She  does not have any pertinent problems on file. She  has no past surgical history on file. Her family history includes Breast cancer in her maternal grandfather and mother; Cancer in her father; Cancer (age of onset: 73) in her cousin; Cancer (age of onset: 30) in her mother; Heart disease in her father; Hyperlipidemia in her father; Hypertension in her father; and Hypothyroidism in her father. She  reports that she has never smoked. She does not have any smokeless tobacco history on file. She reports that  drinks alcohol. She reports that she does not use illicit drugs. She has a current medication list which includes the following prescription(s): alprazolam, omeprazole, venlafaxine xr, and minocycline. Current Outpatient Prescriptions on File Prior to Visit  Medication Sig Dispense Refill  . ALPRAZolam (XANAX) 0.25 MG tablet 1 po tid prn  30 tablet  2  . venlafaxine XR (EFFEXOR XR) 150 MG 24 hr capsule Take 1 capsule (150 mg total) by mouth daily.   30 capsule  2   No current facility-administered medications on file prior to visit.   She has No Known Allergies..  Review of Systems Review of Systems  Constitutional: Negative for activity change, appetite change and fatigue.  HENT: Negative for hearing loss, congestion, tinnitus and ear discharge.  dentist q31m Eyes: Negative for visual disturbance (see optho q1y -- vision corrected to 20/20 with glasses).  Respiratory: Negative for cough, chest tightness and shortness of breath.   Cardiovascular: Negative for chest pain, palpitations and leg swelling.  Gastrointestinal: Negative for abdominal pain, diarrhea, constipation and abdominal distention.  Genitourinary: Negative for urgency, frequency, decreased urine volume and difficulty urinating.  Musculoskeletal: Negative for back pain, arthralgias and gait problem.  Skin: Negative for color change, pallor and rash.  Neurological: Negative for dizziness, light-headedness, numbness and headaches.  Hematological: Negative for adenopathy. Does not bruise/bleed easily.  Psychiatric/Behavioral: Negative for suicidal ideas, confusion, sleep disturbance, self-injury, dysphoric mood, decreased concentration and agitation.       Objective:    BP 118/76  Pulse 86  Temp(Src) 98.8 F (37.1 C) (Oral)  Ht 5\' 6"  (1.676 m)  Wt 211 lb (95.709 kg)  BMI 34.07 kg/m2  SpO2 97%  LMP 10/06/2012 General appearance: alert, cooperative, appears stated age and no distress Head: Normocephalic, without obvious abnormality, atraumatic Eyes: conjunctivae/corneas clear. PERRL, EOM's intact. Fundi benign. Ears: normal TM's and external  ear canals both ears Nose: Nares normal. Septum midline. Mucosa normal. No drainage or sinus tenderness. Throat: lips, mucosa, and tongue normal; teeth and gums normal Neck: no adenopathy, no carotid bruit, no JVD, supple, symmetrical, trachea midline and thyroid not enlarged, symmetric, no tenderness/mass/nodules Back:  symmetric, no curvature. ROM normal. No CVA tenderness. Lungs: clear to auscultation bilaterally Breasts: normal appearance, no masses or tenderness Heart: regular rate and rhythm, S1, S2 normal, no murmur, click, rub or gallop Abdomen: soft, non-tender; bowel sounds normal; no masses,  no organomegaly Pelvic: cervix normal in appearance, external genitalia normal, no adnexal masses or tenderness, no cervical motion tenderness, rectovaginal septum normal, uterus normal size, shape, and consistency and vagina normal without discharge Extremities: extremities normal, atraumatic, no cyanosis or edema Pulses: 2+ and symmetric Skin: Skin color, texture, turgor normal. No rashes or lesions Lymph nodes: Cervical, supraclavicular, and axillary nodes normal. Neurologic: Alert and oriented X 3, normal strength and tone. Normal symmetric reflexes. Normal coordination and gait Psych-- no anxiety, no depression      Assessment:    Healthy female exam.      Plan:     ghm utd Check labs See After Visit Summary for Counseling Recommendations

## 2012-11-01 DIAGNOSIS — L709 Acne, unspecified: Secondary | ICD-10-CM | POA: Insufficient documentation

## 2012-11-01 NOTE — Assessment & Plan Note (Signed)
D/w pt diet and exercise  

## 2012-11-01 NOTE — Assessment & Plan Note (Signed)
Restart minocycline bid In 3 months---- dec to 50 mg bid for 3 months then dec to 50 mg daily if able

## 2012-11-01 NOTE — Assessment & Plan Note (Signed)
Check labs 

## 2012-12-06 ENCOUNTER — Other Ambulatory Visit: Payer: Self-pay | Admitting: Family Medicine

## 2013-01-29 ENCOUNTER — Other Ambulatory Visit: Payer: Self-pay

## 2013-03-24 ENCOUNTER — Other Ambulatory Visit: Payer: Self-pay

## 2013-03-24 DIAGNOSIS — Z1231 Encounter for screening mammogram for malignant neoplasm of breast: Secondary | ICD-10-CM

## 2013-03-30 ENCOUNTER — Other Ambulatory Visit: Payer: Self-pay

## 2013-03-30 ENCOUNTER — Ambulatory Visit: Payer: BC Managed Care – PPO

## 2013-04-16 ENCOUNTER — Ambulatory Visit
Admission: RE | Admit: 2013-04-16 | Discharge: 2013-04-16 | Disposition: A | Discharge status: Home / Self Care | Payer: BC Managed Care – PPO | Source: Ambulatory Visit

## 2013-04-16 DIAGNOSIS — Z1231 Encounter for screening mammogram for malignant neoplasm of breast: Secondary | ICD-10-CM

## 2013-06-09 ENCOUNTER — Other Ambulatory Visit: Payer: Self-pay | Admitting: Family Medicine

## 2013-07-12 ENCOUNTER — Other Ambulatory Visit: Payer: Self-pay | Admitting: Family Medicine

## 2013-08-07 ENCOUNTER — Ambulatory Visit (INDEPENDENT_AMBULATORY_CARE_PROVIDER_SITE_OTHER): Payer: BC Managed Care – PPO | Admitting: Family Medicine

## 2013-08-07 ENCOUNTER — Encounter: Payer: Self-pay | Admitting: Family Medicine

## 2013-08-07 VITALS — BP 124/80 | HR 87 | Temp 99.3°F | Wt 210.6 lb

## 2013-08-07 DIAGNOSIS — F411 Generalized anxiety disorder: Secondary | ICD-10-CM

## 2013-08-07 DIAGNOSIS — J019 Acute sinusitis, unspecified: Secondary | ICD-10-CM

## 2013-08-07 MED ORDER — AMOXICILLIN-POT CLAVULANATE 875-125 MG PO TABS: 1.0000 | ORAL_TABLET | Freq: Two times a day (BID) | ORAL | Status: DC

## 2013-08-07 MED ORDER — VENLAFAXINE HCL ER 150 MG PO CP24: ORAL_CAPSULE | ORAL | Status: DC

## 2013-08-07 MED ORDER — LORATADINE 10 MG PO TABS: 10.0000 mg | ORAL_TABLET | Freq: Every day | ORAL | Status: DC

## 2013-08-07 MED ORDER — FLUTICASONE PROPIONATE 50 MCG/ACT NA SUSP: 2.0000 | Freq: Every day | NASAL | Status: DC

## 2013-08-07 NOTE — Patient Instructions (Signed)

## 2013-08-07 NOTE — Progress Notes (Signed)
  Subjective:     Kelli Evans is a 48 y.o. female who presents for evaluation of symptoms of a URI. Symptoms include congestion, cough described as worsening over time, nasal congestion, post nasal drip and laryngitis. Onset of symptoms was 2 weeks ago, and has been gradually worsening since that time. Treatment to date: antihistamines and cough suppressants.  The following portions of the patient's history were reviewed and updated as appropriate: allergies, current medications, past family history, past medical history, past social history, past surgical history and problem list.  Review of Systems Pertinent items are noted in HPI.   Objective:    BP 124/80  Pulse 87  Temp(Src) 99.3 F (37.4 C) (Oral)  Wt 210 lb 9.6 oz (95.528 kg)  SpO2 98%  LMP 07/30/2013 General appearance: alert, cooperative, appears stated age and no distress Ears: normal TM's and external ear canals both ears Nose: Nares normal. Septum midline. Mucosa normal. No drainage or sinus tenderness., green discharge, moderate congestion, turbinates red, swollen, sinus tenderness bilateral Throat: lips, mucosa, and tongue normal; teeth and gums normal Neck: moderate anterior cervical adenopathy, supple, symmetrical, trachea midline and thyroid not enlarged, symmetric, no tenderness/mass/nodules Lungs: clear to auscultation bilaterally Heart: S1, S2 normal Extremities: extremities normal, atraumatic, no cyanosis or edema   Assessment:    Sinusitis   Plan:      1. Generalized anxiety disorder  - venlafaxine XR (EFFEXOR-XR) 150 MG 24 hr capsule; 1 cap by mouth daily--  Dispense: 90 capsule; Refill: 3  2. Sinusitis, acute con't flonase and antihistamine - amoxicillin-clavulanate (AUGMENTIN) 875-125 MG per tablet; Take 1 tablet by mouth 2 (two) times daily.  Dispense: 20 tablet; Refill: 0 - fluticasone (FLONASE) 50 MCG/ACT nasal spray; Place 2 sprays into both nostrils daily.  Dispense: 16 g; Refill: 6 -  loratadine (CLARITIN) 10 MG tablet; Take 1 tablet (10 mg total) by mouth daily.  Dispense: 30 tablet; Refill: 11   1. Generalized anxiety disorder Stable-- refill meds--- rto 1 year - venlafaxine XR (EFFEXOR-XR) 150 MG 24 hr capsule; 1 cap by mouth daily--  Dispense: 90 capsule; Refill: 3

## 2013-08-07 NOTE — Progress Notes (Signed)
Pre visit review using our clinic review tool, if applicable. No additional management support is needed unless otherwise documented below in the visit note. 

## 2013-11-11 ENCOUNTER — Other Ambulatory Visit: Payer: Self-pay

## 2013-11-11 MED ORDER — OMEPRAZOLE MAGNESIUM 20 MG PO TBEC: DELAYED_RELEASE_TABLET | ORAL | Status: DC

## 2014-03-22 ENCOUNTER — Encounter: Payer: Self-pay | Admitting: Family Medicine

## 2014-03-24 ENCOUNTER — Other Ambulatory Visit: Payer: Self-pay | Admitting: *Deleted

## 2014-03-24 ENCOUNTER — Encounter: Payer: Self-pay | Admitting: Physician Assistant

## 2014-03-24 ENCOUNTER — Ambulatory Visit (INDEPENDENT_AMBULATORY_CARE_PROVIDER_SITE_OTHER): Payer: BC Managed Care – PPO | Admitting: Physician Assistant

## 2014-03-24 VITALS — BP 111/71 | HR 94 | Temp 98.2°F | Resp 16 | Ht 67.0 in | Wt 212.5 lb

## 2014-03-24 DIAGNOSIS — M722 Plantar fascial fibromatosis: Secondary | ICD-10-CM

## 2014-03-24 MED ORDER — MELOXICAM 15 MG PO TABS: 15.0000 mg | ORAL_TABLET | Freq: Every day | ORAL | Status: DC

## 2014-03-24 NOTE — Telephone Encounter (Signed)
ERROR

## 2014-03-24 NOTE — Progress Notes (Signed)
Pre visit review using our clinic review tool, if applicable. No additional management support is needed unless otherwise documented below in the visit note/SLS  

## 2014-03-24 NOTE — Patient Instructions (Signed)
Please take Meloxicam daily as directed.  Wear shoes with good arch support in them.  Wear the ACE bandage for compression.  Follow the Cold Can exercises we discussed today.  Follow-up if no improvement over the next week.  Plantar Fasciitis Plantar fasciitis is a common condition that causes foot pain. It is soreness (inflammation) of the band of tough fibrous tissue on the bottom of the foot that runs from the heel bone (calcaneus) to the ball of the foot. The cause of this soreness may be from excessive standing, poor fitting shoes, running on hard surfaces, being overweight, having an abnormal walk, or overuse (this is common in runners) of the painful foot or feet. It is also common in aerobic exercise dancers and ballet dancers. SYMPTOMS  Most people with plantar fasciitis complain of:  Severe pain in the morning on the bottom of their foot especially when taking the first steps out of bed. This pain recedes after a few minutes of walking.  Severe pain is experienced also during walking following a long period of inactivity.  Pain is worse when walking barefoot or up stairs DIAGNOSIS   Your caregiver will diagnose this condition by examining and feeling your foot.  Special tests such as X-rays of your foot, are usually not needed. PREVENTION   Consult a sports medicine professional before beginning a new exercise program.  Walking programs offer a good workout. With walking there is a lower chance of overuse injuries common to runners. There is less impact and less jarring of the joints.  Begin all new exercise programs slowly. If problems or pain develop, decrease the amount of time or distance until you are at a comfortable level.  Wear good shoes and replace them regularly.  Stretch your foot and the heel cords at the back of the ankle (Achilles tendon) both before and after exercise.  Run or exercise on even surfaces that are not hard. For example, asphalt is better than  pavement.  Do not run barefoot on hard surfaces.  If using a treadmill, vary the incline.  Do not continue to workout if you have foot or joint problems. Seek professional help if they do not improve. HOME CARE INSTRUCTIONS   Avoid activities that cause you pain until you recover.  Use ice or cold packs on the problem or painful areas after working out.  Only take over-the-counter or prescription medicines for pain, discomfort, or fever as directed by your caregiver.  Soft shoe inserts or athletic shoes with air or gel sole cushions may be helpful.  If problems continue or become more severe, consult a sports medicine caregiver or your own health care provider. Cortisone is a potent anti-inflammatory medication that may be injected into the painful area. You can discuss this treatment with your caregiver. MAKE SURE YOU:   Understand these instructions.  Will watch your condition.  Will get help right away if you are not doing well or get worse. Document Released: 12/05/2000 Document Revised: 06/04/2011 Document Reviewed: 02/04/2008 [facility] Patient Information 2015 [place], [place]. This information is not intended to replace advice given to you by your health care provider. Make sure you discuss any questions you have with your health care provider.

## 2014-03-28 DIAGNOSIS — M722 Plantar fascial fibromatosis: Secondary | ICD-10-CM | POA: Insufficient documentation

## 2014-03-28 NOTE — Assessment & Plan Note (Signed)
Rx meloxicam daily for 2 weeks. Take with food. Encouraged compression with Ace wrap. Patient instructed to wear supportive footwear. "Cold can" exercises discussed with patient. Follow-up if symptoms are not improving.

## 2014-03-28 NOTE — Progress Notes (Signed)
Patient presents to clinic today c/o intermittent pain in her left heel, without noted trauma or injury, that has been present for 2 months. Patient states symptoms have been especially bad over the past couple of weeks. Patient endorses she stands a lot at work. Endorses the first attempt after resting is significantly painful. Pain also worse after ambulating long distances. Patient denies redness, swelling or warmth of the affected extremity. Denies radiation of pain elsewhere. Has a positive history of plantar fasciitis many years ago.  Past Medical History  Diagnosis Date  . GERD (gastroesophageal reflux disease)   . Hyperlipidemia   . Hypothyroidism     Current Outpatient Prescriptions on File Prior to Visit  Medication Sig Dispense Refill  . ALPRAZolam (XANAX) 0.25 MG tablet 1 po tid prn 30 tablet 2  . fluticasone (FLONASE) 50 MCG/ACT nasal spray Place 2 sprays into both nostrils daily. (Patient taking differently: Place 2 sprays into both nostrils daily as needed. ) 16 g 6  . loratadine (CLARITIN) 10 MG tablet Take 1 tablet (10 mg total) by mouth daily. (Patient taking differently: Take 10 mg by mouth daily as needed. ) 30 tablet 11  . omeprazole (PRILOSEC OTC) 20 MG tablet TAKE ONE TABLET BY MOUTH ONE TIME DAILY 42 tablet 11  . venlafaxine XR (EFFEXOR-XR) 150 MG 24 hr capsule 1 cap by mouth daily-- 90 capsule 3   No current facility-administered medications on file prior to visit.    No Known Allergies  Family History  Problem Relation Age of Onset  . Breast cancer Maternal Grandfather   . Breast cancer Mother   . Cancer Mother 21    breast  . Hypertension Father   . Heart disease Father     chf, pacemaker  . Cancer Father     brain  . Hyperlipidemia Father   . Hypothyroidism Father   . Cancer Cousin 57    breast     History   Social History  . Marital Status: Single    Spouse Name: N/A    Number of Children: N/A  . Years of Education: N/A   Social  History Main Topics  . Smoking status: Never Smoker   . Smokeless tobacco: None  . Alcohol Use: Yes  . Drug Use: No  . Sexual Activity: None   Other Topics Concern  . None   Social History Narrative   Exercise--  Walks dog 4-5 x a week---2-3 miles, yoga   Review of Systems - See HPI.  All other ROS are negative.  BP 111/71 mmHg  Pulse 94  Temp(Src) 98.2 F (36.8 C) (Oral)  Resp 16  Ht 5\' 7"  (1.702 m)  Wt 212 lb 8 oz (96.389 kg)  BMI 33.27 kg/m2  SpO2 99%  LMP 03/08/2014  Physical Exam  Constitutional: She is oriented to person, place, and time and well-developed, well-nourished, and in no distress.  HENT:  Head: Normocephalic and atraumatic.  Cardiovascular: Normal rate, regular rhythm, normal heart sounds and intact distal pulses.   Pulmonary/Chest: Effort normal. No respiratory distress. She has no wheezes. She has no rales. She exhibits no tenderness.  Musculoskeletal:       Left ankle: Achilles tendon normal. Achilles tendon exhibits no pain, no defect and normal Thompson's test results.       Feet:  Neurological: She is alert and oriented to person, place, and time.  Skin: Skin is warm and dry.  Psychiatric: Affect normal.  Vitals reviewed.    Assessment/Plan:  Plantar fasciitis, left Rx meloxicam daily for 2 weeks. Take with food. Encouraged compression with Ace wrap. Patient instructed to wear supportive footwear. "Cold can" exercises discussed with patient. Follow-up if symptoms are not improving.

## 2014-03-29 ENCOUNTER — Telehealth: Payer: Self-pay | Admitting: *Deleted

## 2014-03-29 DIAGNOSIS — F410 Panic disorder [episodic paroxysmal anxiety] without agoraphobia: Secondary | ICD-10-CM

## 2014-03-29 MED ORDER — ALPRAZOLAM 0.25 MG PO TABS: ORAL_TABLET | ORAL | Status: DC

## 2014-03-29 NOTE — Telephone Encounter (Signed)
Refill request faxed from pharmacy for Xanax 0.25mg .    Last office visit 08/07/13 for anxiety.   No UDS found.   Last refill 02/18/12. Please advise

## 2014-03-29 NOTE — Telephone Encounter (Signed)
Pt gets very rarely---- ok to refill x1 No uds needed

## 2014-03-29 NOTE — Telephone Encounter (Signed)
Rx faxed.    KP 

## 2014-06-25 ENCOUNTER — Telehealth: Payer: Self-pay | Admitting: *Deleted

## 2014-06-25 NOTE — Telephone Encounter (Signed)
She would need ov

## 2014-06-25 NOTE — Telephone Encounter (Signed)
Received fax request from [facility] for minocycline HCL 100mg  twice a day. Last rx by our office sent in 2014. Rx last dispensed by pharmacy 05/14/13.  Pt is due for 1 year f/u 07/2014.  Please advise refill?

## 2014-06-28 NOTE — Telephone Encounter (Signed)
Please offer the patient an apt per Dr.Lowne.     KP

## 2014-06-28 NOTE — Telephone Encounter (Signed)
Left message for patient to return my call.

## 2014-06-30 ENCOUNTER — Other Ambulatory Visit: Payer: Self-pay

## 2014-06-30 DIAGNOSIS — L700 Acne vulgaris: Secondary | ICD-10-CM

## 2014-06-30 MED ORDER — MINOCYCLINE HCL 100 MG PO CAPS: 100.0000 mg | ORAL_CAPSULE | Freq: Two times a day (BID) | ORAL | Status: DC

## 2014-09-29 ENCOUNTER — Other Ambulatory Visit: Payer: Self-pay | Admitting: Family Medicine

## 2014-11-01 ENCOUNTER — Encounter: Payer: Self-pay | Admitting: Family Medicine

## 2014-11-01 ENCOUNTER — Ambulatory Visit (INDEPENDENT_AMBULATORY_CARE_PROVIDER_SITE_OTHER): Payer: BC Managed Care – PPO | Admitting: Family Medicine

## 2014-11-01 VITALS — BP 119/73 | HR 84 | Temp 98.5°F | Resp 18 | Ht 65.75 in | Wt 208.0 lb

## 2014-11-01 DIAGNOSIS — F411 Generalized anxiety disorder: Secondary | ICD-10-CM

## 2014-11-01 DIAGNOSIS — K219 Gastro-esophageal reflux disease without esophagitis: Secondary | ICD-10-CM

## 2014-11-01 DIAGNOSIS — F410 Panic disorder [episodic paroxysmal anxiety] without agoraphobia: Secondary | ICD-10-CM

## 2014-11-01 DIAGNOSIS — Z0000 Encounter for general adult medical examination without abnormal findings: Secondary | ICD-10-CM

## 2014-11-01 DIAGNOSIS — D229 Melanocytic nevi, unspecified: Secondary | ICD-10-CM

## 2014-11-01 MED ORDER — ALPRAZOLAM 0.25 MG PO TABS: ORAL_TABLET | ORAL | Status: DC

## 2014-11-01 MED ORDER — OMEPRAZOLE MAGNESIUM 20 MG PO TBEC: DELAYED_RELEASE_TABLET | ORAL | Status: DC

## 2014-11-01 MED ORDER — VENLAFAXINE HCL ER 150 MG PO CP24: 150.0000 mg | ORAL_CAPSULE | Freq: Every day | ORAL | Status: DC

## 2014-11-01 NOTE — Progress Notes (Signed)
Subjective:     Kelli Evans is a 49 y.o. female and is here for a comprehensive physical exam. The patient reports no problems.  History   Social History  . Marital Status: Single    Spouse Name: N/A  . Number of Children: N/A  . Years of Education: N/A   Occupational History  . Not on file.   Social History Main Topics  . Smoking status: Never Smoker   . Smokeless tobacco: Not on file  . Alcohol Use: Yes  . Drug Use: No  . Sexual Activity: No   Other Topics Concern  . Not on file   Social History Narrative   Exercise--  Walks dog 4-5 x a week---2-3 miles, yoga   Health Maintenance  Topic Date Due  . MAMMOGRAM  04/16/2014  . INFLUENZA VACCINE  10/25/2014  . HIV Screening  11/01/2015 (Originally 02/11/1981)  . PAP SMEAR  11/01/2015  . TETANUS/TDAP  10/04/2019    The following portions of the patient's history were reviewed and updated as appropriate:  She  has a past medical history of GERD (gastroesophageal reflux disease); Hyperlipidemia; and Hypothyroidism. She  does not have any pertinent problems on file. She  has no past surgical history on file. Her family history includes Breast cancer in her maternal grandfather and mother; Cancer in her daughter and father; Cancer (age of onset: 66) in her cousin; Cancer (age of onset: 49) in her mother; Heart disease in her father; Hyperlipidemia in her father; Hypertension in her father; Hypothyroidism in her father. She  reports that she has never smoked. She does not have any smokeless tobacco history on file. She reports that she drinks alcohol. She reports that she does not use illicit drugs. She has a current medication list which includes the following prescription(s): alprazolam, omeprazole, venlafaxine xr, fluticasone, and loratadine. Current Outpatient Prescriptions on File Prior to Visit  Medication Sig Dispense Refill  . fluticasone (FLONASE) 50 MCG/ACT nasal spray Place 2 sprays into both nostrils daily.  (Patient not taking: Reported on 11/01/2014) 16 g 6  . loratadine (CLARITIN) 10 MG tablet Take 1 tablet (10 mg total) by mouth daily. (Patient not taking: Reported on 11/01/2014) 30 tablet 11   No current facility-administered medications on file prior to visit.   She has No Known Allergies..  Review of Systems Review of Systems  Constitutional: Negative for activity change, appetite change and fatigue.  HENT: Negative for hearing loss, congestion, tinnitus and ear discharge.  dentist q8m Eyes: Negative for visual disturbance (see optho q1y -- vision corrected to 20/20 with glasses).  Respiratory: Negative for cough, chest tightness and shortness of breath.   Cardiovascular: Negative for chest pain, palpitations and leg swelling.  Gastrointestinal: Negative for abdominal pain, diarrhea, constipation and abdominal distention.  Genitourinary: Negative for urgency, frequency, decreased urine volume and difficulty urinating.  Musculoskeletal: Negative for back pain, arthralgias and gait problem.  Skin: Negative for color change, pallor and rash.  Neurological: Negative for dizziness, light-headedness, numbness and headaches.  Hematological: Negative for adenopathy. Does not bruise/bleed easily.  Psychiatric/Behavioral: Negative for suicidal ideas, confusion, sleep disturbance, self-injury, dysphoric mood, decreased concentration and agitation.       Objective:    BP 119/73 mmHg  Pulse 84  Temp(Src) 98.5 F (36.9 C) (Oral)  Resp 18  Ht 5' 5.75" (1.67 m)  Wt 208 lb (94.348 kg)  BMI 33.83 kg/m2  SpO2 100%  LMP 10/25/2014 General appearance: alert, cooperative, appears stated age and no distress  Head: Normocephalic, without obvious abnormality, atraumatic Eyes: conjunctivae/corneas clear. PERRL, EOM's intact. Fundi benign. Ears: normal TM's and external ear canals both ears Nose: Nares normal. Septum midline. Mucosa normal. No drainage or sinus tenderness. Throat: lips, mucosa, and  tongue normal; teeth and gums normal Neck: no adenopathy, no carotid bruit, no JVD, supple, symmetrical, trachea midline and thyroid not enlarged, symmetric, no tenderness/mass/nodules Back: symmetric, no curvature. ROM normal. No CVA tenderness. Lungs: clear to auscultation bilaterally Breasts: normal appearance, no masses or tenderness Heart: S1, S2 normal Abdomen: soft, non-tender; bowel sounds normal; no masses,  no organomegaly Pelvic: deferred Extremities: extremities normal, atraumatic, no cyanosis or edema Pulses: 2+ and symmetric Skin: Skin color, texture, turgor normal. No rashes or lesions Lymph nodes: Cervical, supraclavicular, and axillary nodes normal. Neurologic: Alert and oriented X 3, normal strength and tone. Normal symmetric reflexes. Normal coordination and gait Psych- no depression, no anxiety      Assessment:    Healthy female exam.     Plan:     See After Visit Summary for Counseling Recommendations  check labs ghm utd  1. Preventative health care  - Basic metabolic panel - CBC with Differential/Platelet - Hepatic function panel - Lipid panel - POCT urinalysis dipstick - TSH  2. Panic  - ALPRAZolam (XANAX) 0.25 MG tablet; 1 po tid prn  Dispense: 30 tablet; Refill: 1  3. Generalized anxiety disorder  - ALPRAZolam (XANAX) 0.25 MG tablet; 1 po tid prn  Dispense: 30 tablet; Refill: 1 - venlafaxine XR (EFFEXOR-XR) 150 MG 24 hr capsule; Take 1 capsule (150 mg total) by mouth daily. 30 day only. The patient is Overdue for an appointment  Dispense: 30 capsule; Refill: 0  4. Gastroesophageal reflux disease, esophagitis presence not specified  - omeprazole (PRILOSEC OTC) 20 MG tablet; TAKE ONE TABLET BY MOUTH ONE TIME DAILY  Dispense: 42 tablet; Refill: 11  5. Nevus of multiple sites  - Ambulatory referral to Dermatology

## 2014-11-01 NOTE — Patient Instructions (Signed)
Preventive Care for Adults A healthy lifestyle and preventive care can promote health and wellness. Preventive health guidelines for women include the following key practices.  A routine yearly physical is a good way to check with your health care provider about your health and preventive screening. It is a chance to share any concerns and updates on your health and to receive a thorough exam.  Visit your dentist for a routine exam and preventive care every 6 months. Brush your teeth twice a day and floss once a day. Good oral hygiene prevents tooth decay and gum disease.  The frequency of eye exams is based on your age, health, family medical history, use of contact lenses, and other factors. Follow your health care provider's recommendations for frequency of eye exams.  Eat a healthy diet. Foods like vegetables, fruits, whole grains, low-fat dairy products, and lean protein foods contain the nutrients you need without too many calories. Decrease your intake of foods high in solid fats, added sugars, and salt. Eat the right amount of calories for you.Get information about a proper diet from your health care provider, if necessary.  Regular physical exercise is one of the most important things you can do for your health. Most adults should get at least 150 minutes of moderate-intensity exercise (any activity that increases your heart rate and causes you to sweat) each week. In addition, most adults need muscle-strengthening exercises on 2 or more days a week.  Maintain a healthy weight. The body mass index (BMI) is a screening tool to identify possible weight problems. It provides an estimate of body fat based on height and weight. Your health care provider can find your BMI and can help you achieve or maintain a healthy weight.For adults 20 years and older:  A BMI below 18.5 is considered underweight.  A BMI of 18.5 to 24.9 is normal.  A BMI of 25 to 29.9 is considered overweight.  A BMI of  30 and above is considered obese.  Maintain normal blood lipids and cholesterol levels by exercising and minimizing your intake of saturated fat. Eat a balanced diet with plenty of fruit and vegetables. Blood tests for lipids and cholesterol should begin at age 48 and be repeated every 5 years. If your lipid or cholesterol levels are high, you are over 50, or you are at high risk for heart disease, you may need your cholesterol levels checked more frequently.Ongoing high lipid and cholesterol levels should be treated with medicines if diet and exercise are not working.  If you smoke, find out from your health care provider how to quit. If you do not use tobacco, do not start.  Lung cancer screening is recommended for adults aged 71-80 years who are at high risk for developing lung cancer because of a history of smoking. A yearly low-dose CT scan of the lungs is recommended for people who have at least a 30-pack-year history of smoking and are a current smoker or have quit within the past 15 years. A pack year of smoking is smoking an average of 1 pack of cigarettes a day for 1 year (for example: 1 pack a day for 30 years or 2 packs a day for 15 years). Yearly screening should continue until the smoker has stopped smoking for at least 15 years. Yearly screening should be stopped for people who develop a health problem that would prevent them from having lung cancer treatment.  If you are pregnant, do not drink alcohol. If you are breastfeeding,  be very cautious about drinking alcohol. If you are not pregnant and choose to drink alcohol, do not have more than 1 drink per day. One drink is considered to be 12 ounces (355 mL) of beer, 5 ounces (148 mL) of wine, or 1.5 ounces (44 mL) of liquor.  Avoid use of street drugs. Do not share needles with anyone. Ask for help if you need support or instructions about stopping the use of drugs.  High blood pressure causes heart disease and increases the risk of  stroke. Your blood pressure should be checked at least every 1 to 2 years. Ongoing high blood pressure should be treated with medicines if weight loss and exercise do not work.  If you are 75-52 years old, ask your health care provider if you should take aspirin to prevent strokes.  Diabetes screening involves taking a blood sample to check your fasting blood sugar level. This should be done once every 3 years, after age 15, if you are within normal weight and without risk factors for diabetes. Testing should be considered at a younger age or be carried out more frequently if you are overweight and have at least 1 risk factor for diabetes.  Breast cancer screening is essential preventive care for women. You should practice "breast self-awareness." This means understanding the normal appearance and feel of your breasts and may include breast self-examination. Any changes detected, no matter how small, should be reported to a health care provider. Women in their 58s and 30s should have a clinical breast exam (CBE) by a health care provider as part of a regular health exam every 1 to 3 years. After age 16, women should have a CBE every year. Starting at age 53, women should consider having a mammogram (breast X-ray test) every year. Women who have a family history of breast cancer should talk to their health care provider about genetic screening. Women at a high risk of breast cancer should talk to their health care providers about having an MRI and a mammogram every year.  Breast cancer gene (BRCA)-related cancer risk assessment is recommended for women who have family members with BRCA-related cancers. BRCA-related cancers include breast, ovarian, tubal, and peritoneal cancers. Having family members with these cancers may be associated with an increased risk for harmful changes (mutations) in the breast cancer genes BRCA1 and BRCA2. Results of the assessment will determine the need for genetic counseling and  BRCA1 and BRCA2 testing.  Routine pelvic exams to screen for cancer are no longer recommended for nonpregnant women who are considered low risk for cancer of the pelvic organs (ovaries, uterus, and vagina) and who do not have symptoms. Ask your health care provider if a screening pelvic exam is right for you.  If you have had past treatment for cervical cancer or a condition that could lead to cancer, you need Pap tests and screening for cancer for at least 20 years after your treatment. If Pap tests have been discontinued, your risk factors (such as having a new sexual partner) need to be reassessed to determine if screening should be resumed. Some women have medical problems that increase the chance of getting cervical cancer. In these cases, your health care provider may recommend more frequent screening and Pap tests.  The HPV test is an additional test that may be used for cervical cancer screening. The HPV test looks for the virus that can cause the cell changes on the cervix. The cells collected during the Pap test can be  tested for HPV. The HPV test could be used to screen women aged 30 years and older, and should be used in women of any age who have unclear Pap test results. After the age of 30, women should have HPV testing at the same frequency as a Pap test.  Colorectal cancer can be detected and often prevented. Most routine colorectal cancer screening begins at the age of 50 years and continues through age 75 years. However, your health care provider may recommend screening at an earlier age if you have risk factors for colon cancer. On a yearly basis, your health care provider may provide home test kits to check for hidden blood in the stool. Use of a small camera at the end of a tube, to directly examine the colon (sigmoidoscopy or colonoscopy), can detect the earliest forms of colorectal cancer. Talk to your health care provider about this at age 50, when routine screening begins. Direct  exam of the colon should be repeated every 5-10 years through age 75 years, unless early forms of pre-cancerous polyps or small growths are found.  People who are at an increased risk for hepatitis B should be screened for this virus. You are considered at high risk for hepatitis B if:  You were born in a country where hepatitis B occurs often. Talk with your health care provider about which countries are considered high risk.  Your parents were born in a high-risk country and you have not received a shot to protect against hepatitis B (hepatitis B vaccine).  You have HIV or AIDS.  You use needles to inject street drugs.  You live with, or have sex with, someone who has hepatitis B.  You get hemodialysis treatment.  You take certain medicines for conditions like cancer, organ transplantation, and autoimmune conditions.  Hepatitis C blood testing is recommended for all people born from 1945 through 1965 and any individual with known risks for hepatitis C.  Practice safe sex. Use condoms and avoid high-risk sexual practices to reduce the spread of sexually transmitted infections (STIs). STIs include gonorrhea, chlamydia, syphilis, trichomonas, herpes, HPV, and human immunodeficiency virus (HIV). Herpes, HIV, and HPV are viral illnesses that have no cure. They can result in disability, cancer, and death.  You should be screened for sexually transmitted illnesses (STIs) including gonorrhea and chlamydia if:  You are sexually active and are younger than 24 years.  You are older than 24 years and your health care provider tells you that you are at risk for this type of infection.  Your sexual activity has changed since you were last screened and you are at an increased risk for chlamydia or gonorrhea. Ask your health care provider if you are at risk.  If you are at risk of being infected with HIV, it is recommended that you take a prescription medicine daily to prevent HIV infection. This is  called preexposure prophylaxis (PrEP). You are considered at risk if:  You are a heterosexual woman, are sexually active, and are at increased risk for HIV infection.  You take drugs by injection.  You are sexually active with a partner who has HIV.  Talk with your health care provider about whether you are at high risk of being infected with HIV. If you choose to begin PrEP, you should first be tested for HIV. You should then be tested every 3 months for as long as you are taking PrEP.  Osteoporosis is a disease in which the bones lose minerals and strength   with aging. This can result in serious bone fractures or breaks. The risk of osteoporosis can be identified using a bone density scan. Women ages 53 years and over and women at risk for fractures or osteoporosis should discuss screening with their health care providers. Ask your health care provider whether you should take a calcium supplement or vitamin D to reduce the rate of osteoporosis.  Menopause can be associated with physical symptoms and risks. Hormone replacement therapy is available to decrease symptoms and risks. You should talk to your health care provider about whether hormone replacement therapy is right for you.  Use sunscreen. Apply sunscreen liberally and repeatedly throughout the day. You should seek shade when your shadow is shorter than you. Protect yourself by wearing long sleeves, pants, a wide-brimmed hat, and sunglasses year round, whenever you are outdoors.  Once a month, do a whole body skin exam, using a mirror to look at the skin on your back. Tell your health care provider of new moles, moles that have irregular borders, moles that are larger than a pencil eraser, or moles that have changed in shape or color.  Stay current with required vaccines (immunizations).  Influenza vaccine. All adults should be immunized every year.  Tetanus, diphtheria, and acellular pertussis (Td, Tdap) vaccine. Pregnant women should  receive 1 dose of Tdap vaccine during each pregnancy. The dose should be obtained regardless of the length of time since the last dose. Immunization is preferred during the 27th-36th week of gestation. An adult who has not previously received Tdap or who does not know her vaccine status should receive 1 dose of Tdap. This initial dose should be followed by tetanus and diphtheria toxoids (Td) booster doses every 10 years. Adults with an unknown or incomplete history of completing a 3-dose immunization series with Td-containing vaccines should begin or complete a primary immunization series including a Tdap dose. Adults should receive a Td booster every 10 years.  Varicella vaccine. An adult without evidence of immunity to varicella should receive 2 doses or a second dose if she has previously received 1 dose. Pregnant females who do not have evidence of immunity should receive the first dose after pregnancy. This first dose should be obtained before leaving the health care facility. The second dose should be obtained 4-8 weeks after the first dose.  Human papillomavirus (HPV) vaccine. Females aged 13-26 years who have not received the vaccine previously should obtain the 3-dose series. The vaccine is not recommended for use in pregnant females. However, pregnancy testing is not needed before receiving a dose. If a female is found to be pregnant after receiving a dose, no treatment is needed. In that case, the remaining doses should be delayed until after the pregnancy. Immunization is recommended for any person with an immunocompromised condition through the age of 9 years if she did not get any or all doses earlier. During the 3-dose series, the second dose should be obtained 4-8 weeks after the first dose. The third dose should be obtained 24 weeks after the first dose and 16 weeks after the second dose.  Zoster vaccine. One dose is recommended for adults aged 26 years or older unless certain conditions are  present.  Measles, mumps, and rubella (MMR) vaccine. Adults born before [birth year] generally are considered immune to measles and mumps. Adults born in [birth year] or later should have 1 or more doses of MMR vaccine unless there is a contraindication to the vaccine or there is laboratory evidence of immunity to  each of the three diseases. A routine second dose of MMR vaccine should be obtained at least 28 days after the first dose for students attending postsecondary schools, health care workers, or international travelers. People who received inactivated measles vaccine or an unknown type of measles vaccine during 1963-1967 should receive 2 doses of MMR vaccine. People who received inactivated mumps vaccine or an unknown type of mumps vaccine before 1979 and are at high risk for mumps infection should consider immunization with 2 doses of MMR vaccine. For females of childbearing age, rubella immunity should be determined. If there is no evidence of immunity, females who are not pregnant should be vaccinated. If there is no evidence of immunity, females who are pregnant should delay immunization until after pregnancy. Unvaccinated health care workers born before 1957 who lack laboratory evidence of measles, mumps, or rubella immunity or laboratory confirmation of disease should consider measles and mumps immunization with 2 doses of MMR vaccine or rubella immunization with 1 dose of MMR vaccine.  Pneumococcal 13-valent conjugate (PCV13) vaccine. When indicated, a person who is uncertain of her immunization history and has no record of immunization should receive the PCV13 vaccine. An adult aged 19 years or older who has certain medical conditions and has not been previously immunized should receive 1 dose of PCV13 vaccine. This PCV13 should be followed with a dose of pneumococcal polysaccharide (PPSV23) vaccine. The PPSV23 vaccine dose should be obtained at least 8 weeks after the dose of PCV13 vaccine. An adult aged 19  years or older who has certain medical conditions and previously received 1 or more doses of PPSV23 vaccine should receive 1 dose of PCV13. The PCV13 vaccine dose should be obtained 1 or more years after the last PPSV23 vaccine dose.  Pneumococcal polysaccharide (PPSV23) vaccine. When PCV13 is also indicated, PCV13 should be obtained first. All adults aged 65 years and older should be immunized. An adult younger than age 65 years who has certain medical conditions should be immunized. Any person who resides in a nursing home or long-term care facility should be immunized. An adult smoker should be immunized. People with an immunocompromised condition and certain other conditions should receive both PCV13 and PPSV23 vaccines. People with human immunodeficiency virus (HIV) infection should be immunized as soon as possible after diagnosis. Immunization during chemotherapy or radiation therapy should be avoided. Routine use of PPSV23 vaccine is not recommended for American Indians, [place] Natives, or people younger than 65 years unless there are medical conditions that require PPSV23 vaccine. When indicated, people who have unknown immunization and have no record of immunization should receive PPSV23 vaccine. One-time revaccination 5 years after the first dose of PPSV23 is recommended for people aged 19-64 years who have chronic kidney failure, nephrotic syndrome, asplenia, or immunocompromised conditions. People who received 1-2 doses of PPSV23 before age 65 years should receive another dose of PPSV23 vaccine at age 65 years or later if at least 5 years have passed since the previous dose. Doses of PPSV23 are not needed for people immunized with PPSV23 at or after age 65 years.  Meningococcal vaccine. Adults with asplenia or persistent complement component deficiencies should receive 2 doses of quadrivalent meningococcal conjugate (MenACWY-D) vaccine. The doses should be obtained at least 2 months apart.  Microbiologists working with certain meningococcal bacteria, military recruits, people at risk during an outbreak, and people who travel to or live in countries with a high rate of meningitis should be immunized. A first-year college student up through age   21 years who is living in a residence hall should receive a dose if she did not receive a dose on or after her 16th birthday. Adults who have certain high-risk conditions should receive one or more doses of vaccine.  Hepatitis A vaccine. Adults who wish to be protected from this disease, have certain high-risk conditions, work with hepatitis A-infected animals, work in hepatitis A research labs, or travel to or work in countries with a high rate of hepatitis A should be immunized. Adults who were previously unvaccinated and who anticipate close contact with an international adoptee during the first 60 days after arrival in the [place] from a country with a high rate of hepatitis A should be immunized.  Hepatitis B vaccine. Adults who wish to be protected from this disease, have certain high-risk conditions, may be exposed to blood or other infectious body fluids, are household contacts or sex partners of hepatitis B positive people, are clients or workers in certain care facilities, or travel to or work in countries with a high rate of hepatitis B should be immunized.  Haemophilus influenzae type b (Hib) vaccine. A previously unvaccinated person with asplenia or sickle cell disease or having a scheduled splenectomy should receive 1 dose of Hib vaccine. Regardless of previous immunization, a recipient of a hematopoietic stem cell transplant should receive a 3-dose series 6-12 months after her successful transplant. Hib vaccine is not recommended for adults with HIV infection. Preventive Services / Frequency Ages 64 to 68 years  Blood pressure check.** / Every 1 to 2 years.  Lipid and cholesterol check.** / Every 5 years beginning at age  22.  Clinical breast exam.** / Every 3 years for women in their 88s and 53s.  BRCA-related cancer risk assessment.** / For women who have family members with a BRCA-related cancer (breast, ovarian, tubal, or peritoneal cancers).  Pap test.** / Every 2 years from ages 90 through 51. Every 3 years starting at age 21 through age 56 or 3 with a history of 3 consecutive normal Pap tests.  HPV screening.** / Every 3 years from ages 24 through ages 1 to 46 with a history of 3 consecutive normal Pap tests.  Hepatitis C blood test.** / For any individual with known risks for hepatitis C.  Skin self-exam. / Monthly.  Influenza vaccine. / Every year.  Tetanus, diphtheria, and acellular pertussis (Tdap, Td) vaccine.** / Consult your health care provider. Pregnant women should receive 1 dose of Tdap vaccine during each pregnancy. 1 dose of Td every 10 years.  Varicella vaccine.** / Consult your health care provider. Pregnant females who do not have evidence of immunity should receive the first dose after pregnancy.  HPV vaccine. / 3 doses over 6 months, if 72 and younger. The vaccine is not recommended for use in pregnant females. However, pregnancy testing is not needed before receiving a dose.  Measles, mumps, rubella (MMR) vaccine.** / You need at least 1 dose of MMR if you were born in 1957 or later. You may also need a 2nd dose. For females of childbearing age, rubella immunity should be determined. If there is no evidence of immunity, females who are not pregnant should be vaccinated. If there is no evidence of immunity, females who are pregnant should delay immunization until after pregnancy.  Pneumococcal 13-valent conjugate (PCV13) vaccine.** / Consult your health care provider.  Pneumococcal polysaccharide (PPSV23) vaccine.** / 1 to 2 doses if you smoke cigarettes or if you have certain conditions.  Meningococcal vaccine.** /  1 dose if you are age 55 to 36 years and a [place] living in a residence hall, or have one of several medical conditions, you need to get vaccinated against meningococcal disease. You may also need additional booster doses.  Hepatitis A vaccine.** / Consult your health care provider.  Hepatitis B vaccine.** / Consult your health care provider.  Haemophilus influenzae type b (Hib) vaccine.** / Consult your health care provider. Ages 22 to 70 years  Blood pressure check.** / Every 1 to 2 years.  Lipid and cholesterol check.** / Every 5 years beginning at age 74 years.  Lung cancer screening. / Every year if you are aged 109-80 years and have a 30-pack-year history of smoking and currently smoke or have quit within the past 15 years. Yearly screening is stopped once you have quit smoking for at least 15 years or develop a health problem that would prevent you from having lung cancer treatment.  Clinical breast exam.** / Every year after age 36 years.  BRCA-related cancer risk assessment.** / For women who have family members with a BRCA-related cancer (breast, ovarian, tubal, or peritoneal cancers).  Mammogram.** / Every year beginning at age 26 years and continuing for as long as you are in good health. Consult with your health care provider.  Pap test.** / Every 3 years starting at age 89 years through age 81 or 31 years with a history of 3 consecutive normal Pap tests.  HPV screening.** / Every 3 years from ages 24 years through ages 63 to 39 years with a history of 3 consecutive normal Pap tests.  Fecal occult blood test (FOBT) of stool. / Every year beginning at age 65 years and continuing until age 76 years. You may not need to do this test if you get a colonoscopy every 10 years.  Flexible sigmoidoscopy or colonoscopy.** / Every 5 years for a flexible sigmoidoscopy or every 10 years for a colonoscopy beginning at age 79 years and continuing until age 71 years.  Hepatitis C blood test.** / For all people born from [birth year] through  1965 and any individual with known risks for hepatitis C.  Skin self-exam. / Monthly.  Influenza vaccine. / Every year.  Tetanus, diphtheria, and acellular pertussis (Tdap/Td) vaccine.** / Consult your health care provider. Pregnant women should receive 1 dose of Tdap vaccine during each pregnancy. 1 dose of Td every 10 years.  Varicella vaccine.** / Consult your health care provider. Pregnant females who do not have evidence of immunity should receive the first dose after pregnancy.  Zoster vaccine.** / 1 dose for adults aged 27 years or older.  Measles, mumps, rubella (MMR) vaccine.** / You need at least 1 dose of MMR if you were born in 1957 or later. You may also need a 2nd dose. For females of childbearing age, rubella immunity should be determined. If there is no evidence of immunity, females who are not pregnant should be vaccinated. If there is no evidence of immunity, females who are pregnant should delay immunization until after pregnancy.  Pneumococcal 13-valent conjugate (PCV13) vaccine.** / Consult your health care provider.  Pneumococcal polysaccharide (PPSV23) vaccine.** / 1 to 2 doses if you smoke cigarettes or if you have certain conditions.  Meningococcal vaccine.** / Consult your health care provider.  Hepatitis A vaccine.** / Consult your health care provider.  Hepatitis B vaccine.** / Consult your health care provider.  Haemophilus influenzae type b (Hib) vaccine.** / Consult your health care provider. Ages 103  years and over  Blood pressure check.** / Every 1 to 2 years.  Lipid and cholesterol check.** / Every 5 years beginning at age 17 years.  Lung cancer screening. / Every year if you are aged 50-80 years and have a 30-pack-year history of smoking and currently smoke or have quit within the past 15 years. Yearly screening is stopped once you have quit smoking for at least 15 years or develop a health problem that would prevent you from having lung cancer  treatment.  Clinical breast exam.** / Every year after age 7 years.  BRCA-related cancer risk assessment.** / For women who have family members with a BRCA-related cancer (breast, ovarian, tubal, or peritoneal cancers).  Mammogram.** / Every year beginning at age 31 years and continuing for as long as you are in good health. Consult with your health care provider.  Pap test.** / Every 3 years starting at age 68 years through age 51 or 67 years with 3 consecutive normal Pap tests. Testing can be stopped between 65 and 70 years with 3 consecutive normal Pap tests and no abnormal Pap or HPV tests in the past 10 years.  HPV screening.** / Every 3 years from ages 21 years through ages 44 or 90 years with a history of 3 consecutive normal Pap tests. Testing can be stopped between 65 and 70 years with 3 consecutive normal Pap tests and no abnormal Pap or HPV tests in the past 10 years.  Fecal occult blood test (FOBT) of stool. / Every year beginning at age 19 years and continuing until age 39 years. You may not need to do this test if you get a colonoscopy every 10 years.  Flexible sigmoidoscopy or colonoscopy.** / Every 5 years for a flexible sigmoidoscopy or every 10 years for a colonoscopy beginning at age 22 years and continuing until age 8 years.  Hepatitis C blood test.** / For all people born from [birth year] through 1965 and any individual with known risks for hepatitis C.  Osteoporosis screening.** / A one-time screening for women ages 64 years and over and women at risk for fractures or osteoporosis.  Skin self-exam. / Monthly.  Influenza vaccine. / Every year.  Tetanus, diphtheria, and acellular pertussis (Tdap/Td) vaccine.** / 1 dose of Td every 10 years.  Varicella vaccine.** / Consult your health care provider.  Zoster vaccine.** / 1 dose for adults aged 51 years or older.  Pneumococcal 13-valent conjugate (PCV13) vaccine.** / Consult your health care provider.  Pneumococcal  polysaccharide (PPSV23) vaccine.** / 1 dose for all adults aged 62 years and older.  Meningococcal vaccine.** / Consult your health care provider.  Hepatitis A vaccine.** / Consult your health care provider.  Hepatitis B vaccine.** / Consult your health care provider.  Haemophilus influenzae type b (Hib) vaccine.** / Consult your health care provider. ** Family history and personal history of risk and conditions may change your health care provider's recommendations. Document Released: 05/08/2001 Document Revised: 07/27/2013 Document Reviewed: 08/07/2010 [facility] Patient Information 2015 [place], [place]. This information is not intended to replace advice given to you by your health care provider. Make sure you discuss any questions you have with your health care provider.

## 2014-11-01 NOTE — Progress Notes (Signed)
Pre visit review using our clinic review tool, if applicable. No additional management support is needed unless otherwise documented below in the visit note. 

## 2014-11-02 ENCOUNTER — Encounter: Payer: Self-pay | Admitting: Family Medicine

## 2014-11-02 LAB — BASIC METABOLIC PANEL
BUN: 12 mg/dL (ref 6–23)
CALCIUM: 9.6 mg/dL (ref 8.4–10.5)
CO2: 25 mEq/L (ref 19–32)
CREATININE: 0.77 mg/dL (ref 0.40–1.20)
Chloride: 101 mEq/L (ref 96–112)
GFR: 84.78 mL/min (ref >60.00)
Glucose, Bld: 99 mg/dL (ref 70–99)
POTASSIUM: 3.9 meq/L (ref 3.5–5.1)
Sodium: 137 mEq/L (ref 135–145)

## 2014-11-02 LAB — CBC WITH DIFFERENTIAL/PLATELET
Basophils Absolute: 0.1 10*3/uL (ref 0.0–0.1)
Basophils Relative: 2 % (ref 0.0–3.0)
Eosinophils Absolute: 0.2 10*3/uL (ref 0.0–0.7)
Eosinophils Relative: 2.2 % (ref 0.0–5.0)
HEMATOCRIT: 36.7 % (ref 36.0–46.0)
Hemoglobin: 11.8 g/dL — ABNORMAL LOW (ref 12.0–15.0)
Lymphocytes Relative: 20.7 % (ref 12.0–46.0)
Lymphs Abs: 1.5 10*3/uL (ref 0.7–4.0)
MCHC: 32.2 g/dL (ref 30.0–36.0)
MCV: 74.2 fl — ABNORMAL LOW (ref 78.0–100.0)
MONO ABS: 0.3 10*3/uL (ref 0.1–1.0)
Monocytes Relative: 4.2 % (ref 3.0–12.0)
Neutro Abs: 5.2 10*3/uL (ref 1.4–7.7)
Neutrophils Relative %: 70.9 % (ref 43.0–77.0)
Platelets: 288 10*3/uL (ref 150.0–400.0)
RBC: 4.94 Mil/uL (ref 3.87–5.11)
RDW: 15.8 % — AB (ref 11.5–15.5)
WBC: 7.4 10*3/uL (ref 4.0–10.5)

## 2014-11-02 LAB — LIPID PANEL
CHOL/HDL RATIO: 5
CHOLESTEROL: 227 mg/dL — AB (ref 0–200)
HDL: 48.3 mg/dL (ref >39.00)
LDL Cholesterol: 158 mg/dL — ABNORMAL HIGH (ref 0–99)
NonHDL: 178.47
Triglycerides: 104 mg/dL (ref 0.0–149.0)
VLDL: 20.8 mg/dL (ref 0.0–40.0)

## 2014-11-02 LAB — HEPATIC FUNCTION PANEL
ALT: 17 U/L (ref 0–35)
AST: 17 U/L (ref 0–37)
Albumin: 4.4 g/dL (ref 3.5–5.2)
Alkaline Phosphatase: 69 U/L (ref 39–117)
BILIRUBIN DIRECT: 0.1 mg/dL (ref 0.0–0.3)
TOTAL PROTEIN: 7.7 g/dL (ref 6.0–8.3)
Total Bilirubin: 0.5 mg/dL (ref 0.2–1.2)

## 2014-11-02 LAB — TSH: TSH: 2.8 u[IU]/mL (ref 0.35–4.50)

## 2014-11-03 ENCOUNTER — Other Ambulatory Visit: Payer: Self-pay | Admitting: Family Medicine

## 2014-12-08 ENCOUNTER — Other Ambulatory Visit: Payer: Self-pay | Admitting: Family Medicine

## 2014-12-21 ENCOUNTER — Encounter: Payer: BC Managed Care – PPO | Admitting: Family Medicine

## 2015-04-11 ENCOUNTER — Telehealth: Payer: Self-pay | Admitting: Family Medicine

## 2015-04-14 ENCOUNTER — Other Ambulatory Visit: Payer: Self-pay

## 2015-04-14 DIAGNOSIS — Z1231 Encounter for screening mammogram for malignant neoplasm of breast: Secondary | ICD-10-CM

## 2015-04-20 NOTE — Telephone Encounter (Signed)
flu

## 2015-05-04 ENCOUNTER — Ambulatory Visit
Admission: RE | Admit: 2015-05-04 | Discharge: 2015-05-04 | Disposition: A | Discharge status: Home / Self Care | Payer: BC Managed Care – PPO | Source: Ambulatory Visit

## 2015-05-04 DIAGNOSIS — Z1231 Encounter for screening mammogram for malignant neoplasm of breast: Secondary | ICD-10-CM

## 2015-06-06 ENCOUNTER — Other Ambulatory Visit: Payer: Self-pay | Admitting: Family Medicine

## 2015-06-06 NOTE — Telephone Encounter (Signed)
Last seen 11/01/14 and filled 11/01/14 #30 with 1 refill   Please advise     KP

## 2015-07-25 ENCOUNTER — Other Ambulatory Visit: Payer: Self-pay | Admitting: Family Medicine

## 2015-07-25 NOTE — Telephone Encounter (Signed)
Left message on VM for patient to call the office to schedule CPE with Dr. Carollee Herter in August

## 2015-07-25 NOTE — Telephone Encounter (Signed)
Please schedule this patient a CPE for August.     KP

## 2015-08-01 ENCOUNTER — Encounter: Payer: Self-pay | Admitting: Family Medicine

## 2015-08-01 NOTE — Telephone Encounter (Signed)
Needs ov

## 2015-08-05 NOTE — Telephone Encounter (Signed)
LVM advising patient of message below °

## 2015-10-19 ENCOUNTER — Other Ambulatory Visit: Payer: Self-pay

## 2015-10-19 MED ORDER — VENLAFAXINE HCL ER 150 MG PO CP24: ORAL_CAPSULE | ORAL | 1 refills | Status: DC

## 2015-11-08 ENCOUNTER — Encounter: Payer: BC Managed Care – PPO | Admitting: Family Medicine

## 2015-11-17 ENCOUNTER — Encounter: Payer: BC Managed Care – PPO | Admitting: Family Medicine

## 2015-11-29 ENCOUNTER — Other Ambulatory Visit: Payer: Self-pay | Admitting: Family Medicine

## 2015-12-01 NOTE — Telephone Encounter (Signed)
Last seen 11/01/14 Last filled #30 with 1 refill Please advise----PC

## 2015-12-09 ENCOUNTER — Other Ambulatory Visit: Payer: Self-pay | Admitting: Family Medicine

## 2015-12-09 DIAGNOSIS — K219 Gastro-esophageal reflux disease without esophagitis: Secondary | ICD-10-CM

## 2016-01-10 ENCOUNTER — Encounter: Payer: BC Managed Care – PPO | Admitting: Family Medicine

## 2016-01-14 ENCOUNTER — Encounter: Payer: Self-pay | Admitting: Family Medicine

## 2016-01-14 DIAGNOSIS — K219 Gastro-esophageal reflux disease without esophagitis: Secondary | ICD-10-CM

## 2016-01-16 MED ORDER — OMEPRAZOLE MAGNESIUM 20 MG PO TBEC: 20.0000 mg | DELAYED_RELEASE_TABLET | Freq: Every day | ORAL | 5 refills | Status: DC

## 2016-01-16 NOTE — Telephone Encounter (Signed)
Patient scheduled for 01/30/16 Monday and 3pm.

## 2016-01-16 NOTE — Telephone Encounter (Signed)
Please schedule the patient a cpe.   KP

## 2016-01-30 ENCOUNTER — Encounter: Payer: Self-pay | Admitting: Family Medicine

## 2016-01-30 ENCOUNTER — Ambulatory Visit (INDEPENDENT_AMBULATORY_CARE_PROVIDER_SITE_OTHER): Payer: BC Managed Care – PPO | Admitting: Family Medicine

## 2016-01-30 ENCOUNTER — Other Ambulatory Visit (HOSPITAL_COMMUNITY)
Admission: RE | Admit: 2016-01-30 | Discharge: 2016-01-30 | Disposition: A | Discharge status: Home / Self Care | Payer: BC Managed Care – PPO | Source: Ambulatory Visit | Attending: Family Medicine | Admitting: Family Medicine

## 2016-01-30 VITALS — BP 128/80 | HR 90 | Temp 97.7°F | Resp 16 | Ht 66.0 in | Wt 212.8 lb

## 2016-01-30 DIAGNOSIS — F411 Generalized anxiety disorder: Secondary | ICD-10-CM

## 2016-01-30 DIAGNOSIS — Z01419 Encounter for gynecological examination (general) (routine) without abnormal findings: Secondary | ICD-10-CM | POA: Insufficient documentation

## 2016-01-30 DIAGNOSIS — Z23 Encounter for immunization: Secondary | ICD-10-CM

## 2016-01-30 DIAGNOSIS — Z0000 Encounter for general adult medical examination without abnormal findings: Secondary | ICD-10-CM | POA: Diagnosis not present

## 2016-01-30 DIAGNOSIS — J019 Acute sinusitis, unspecified: Secondary | ICD-10-CM

## 2016-01-30 DIAGNOSIS — Z124 Encounter for screening for malignant neoplasm of cervix: Secondary | ICD-10-CM | POA: Diagnosis not present

## 2016-01-30 DIAGNOSIS — Z1151 Encounter for screening for human papillomavirus (HPV): Secondary | ICD-10-CM | POA: Insufficient documentation

## 2016-01-30 MED ORDER — LORATADINE 10 MG PO TABS: 10.0000 mg | ORAL_TABLET | Freq: Every day | ORAL | 11 refills | Status: DC

## 2016-01-30 MED ORDER — ALPRAZOLAM 0.25 MG PO TABS
0.2500 mg | ORAL_TABLET | Freq: Three times a day (TID) | ORAL | 1 refills | Status: DC | PRN
Start: 2016-01-30 — End: 2016-06-09

## 2016-01-30 NOTE — Progress Notes (Signed)
Subjective:     Kelli Evans is a 50 y.o. female and is here for a comprehensive physical exam. The patient reports no problems.  Social History   Social History  . Marital status: Single    Spouse name: N/A  . Number of children: N/A  . Years of education: N/A   Occupational History  . 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 Smoking status: Never Smoker  . Smokeless tobacco: Never Used  . Alcohol use Yes  . Drug use: No  . Sexual activity: No   Other Topics Concern  . Not on file   Social History Narrative   Exercise--  Walks dog 4-5 x a week---2-3 miles, yoga   Health Maintenance  Topic Date Due  . HIV Screening  02/11/1981  . INFLUENZA VACCINE  10/25/2015  . PAP SMEAR  11/01/2015  . MAMMOGRAM  05/03/2016  . TETANUS/TDAP  10/04/2019    The following portions of the patient's history were reviewed and updated as appropriate:  She  has a past medical history of GERD (gastroesophageal reflux disease); Hyperlipidemia; and Hypothyroidism. She  does not have any pertinent problems on file. She  has no past surgical history on file. Her family history includes Breast cancer in her maternal grandfather and mother; Cancer in her daughter and father; Cancer (age of onset: 74) in her cousin; Cancer (age of onset: 44) in her mother; Heart disease in her father; Hyperlipidemia in her father; Hypertension in her father; Hypothyroidism in her father. She  reports that she has never smoked. She has never used smokeless tobacco. She reports that she drinks alcohol. She reports that she does not use drugs. She has a current medication list which includes the following prescription(s): alprazolam, omeprazole, venlafaxine xr, fluticasone, and loratadine. Current Outpatient Prescriptions on File Prior to Visit  Medication Sig Dispense Refill  . omeprazole (PRILOSEC OTC) 20 MG tablet Take 1 tablet (20 mg total) by mouth daily. 30  tablet 5  . venlafaxine XR (EFFEXOR-XR) 150 MG 24 hr capsule TAKE 1 CAPSULE (150 MG TOTAL) BY MOUTH DAILY. 90 capsule 1  . fluticasone (FLONASE) 50 MCG/ACT nasal spray Place 2 sprays into both nostrils daily. (Patient not taking: Reported on 01/30/2016) 16 g 6   No current facility-administered medications on file prior to visit.    She has No Known Allergies..  Review of Systems Review of Systems  Constitutional: Negative for activity change, appetite change and fatigue.  HENT: Negative for hearing loss, congestion, tinnitus and ear discharge.  dentist q34m Eyes: Negative for visual disturbance (see optho q2y -- vision corrected to 20/20 with glasses).  Respiratory: Negative for cough, chest tightness and shortness of breath.   Cardiovascular: Negative for chest pain, palpitations and leg swelling.  Gastrointestinal: Negative for abdominal pain, diarrhea, constipation and abdominal distention.  Genitourinary: Negative for urgency, frequency, decreased urine volume and difficulty urinating.  Musculoskeletal: Negative for back pain, arthralgias and gait problem.  Skin: Negative for color change, pallor and rash.  Neurological: Negative for dizziness, light-headedness, numbness and headaches.  Hematological: Negative for adenopathy. Does not bruise/bleed easily.  Psychiatric/Behavioral: Negative for suicidal ideas, confusion, sleep disturbance, self-injury, dysphoric mood, decreased concentration and agitation.       Objective:    BP 128/80 (BP Location: Right Arm, Patient Position: Sitting, Cuff Size: Large)   Pulse 90   Temp 97.7 F (36.5 C) (Oral)   Resp 16  Ht 5\' 6"  (1.676 m)   Wt 212 lb 12.8 oz (96.5 kg)   LMP 01/24/2016 (Exact Date)   SpO2 98%   BMI 34.35 kg/m  General appearance: alert, cooperative, appears stated age and no distress Head: Normocephalic, without obvious abnormality, atraumatic Eyes: conjunctivae/corneas clear. PERRL, EOM's intact. Fundi benign. Ears:  normal TM's and external ear canals both ears Nose: Nares normal. Septum midline. Mucosa normal. No drainage or sinus tenderness. Throat: lips, mucosa, and tongue normal; teeth and gums normal Neck: no adenopathy, no carotid bruit, no JVD, supple, symmetrical, trachea midline and thyroid not enlarged, symmetric, no tenderness/mass/nodules Back: symmetric, no curvature. ROM normal. No CVA tenderness. Lungs: clear to auscultation bilaterally Breasts: normal appearance, no masses or tenderness Heart: regular rate and rhythm, S1, S2 normal, no murmur, click, rub or gallop Abdomen: soft, non-tender; bowel sounds normal; no masses,  no organomegaly Pelvic: cervix normal in appearance, external genitalia normal, no adnexal masses or tenderness, no cervical motion tenderness, rectovaginal septum normal, uterus normal size, shape, and consistency, vagina normal without discharge and pap done -- rectal heme neg brown stool  Extremities: extremities normal, atraumatic, no cyanosis or edema Pulses: 2+ and symmetric Skin: Skin color, texture, turgor normal. No rashes or lesions Lymph nodes: Cervical, supraclavicular, and axillary nodes normal. Neurologic: Alert and oriented X 3, normal strength and tone. Normal symmetric reflexes. Normal coordination and gait    Assessment:    Healthy female exam.      Plan:    ghm utd Check labs See avs See After Visit Summary for Counseling Recommendations    1. Preventative health care See labs  - CBC with Differential/Platelet; Future - Lipid panel; Future - Comprehensive metabolic panel; Future - TSH; Future - POCT urinalysis dipstick; Future  2. Acute sinusitis, recurrence not specified, unspecified location flonase -- rto prn  - loratadine (CLARITIN) 10 MG tablet; Take 1 tablet (10 mg total) by mouth daily.  Dispense: 30 tablet; Refill: 11  3. Generalized anxiety disorder stable - ALPRAZolam (XANAX) 0.25 MG tablet; Take 1 tablet (0.25 mg total) by  mouth 3 (three) times daily as needed.  Dispense: 30 tablet; Refill: 1  4. Routine cervical smear   - Cytology - PAP  5. Encounter for immunization   - Flu Vaccine QUAD 36+ mos IM

## 2016-01-30 NOTE — Progress Notes (Signed)
Pre visit review using our clinic review tool, if applicable. No additional management support is needed unless otherwise documented below in the visit note. 

## 2016-01-30 NOTE — Patient Instructions (Signed)
Preventive Care for Adults, Female A healthy lifestyle and preventive care can promote health and wellness. Preventive health guidelines for women include the following key practices.  A routine yearly physical is a good way to check with your health care provider about your health and preventive screening. It is a chance to share any concerns and updates on your health and to receive a thorough exam.  Visit your dentist for a routine exam and preventive care every 6 months. Brush your teeth twice a day and floss once a day. Good oral hygiene prevents tooth decay and gum disease.  The frequency of eye exams is based on your age, health, family medical history, use of contact lenses, and other factors. Follow your health care provider's recommendations for frequency of eye exams.  Eat a healthy diet. Foods like vegetables, fruits, whole grains, low-fat dairy products, and lean protein foods contain the nutrients you need without too many calories. Decrease your intake of foods high in solid fats, added sugars, and salt. Eat the right amount of calories for you.Get information about a proper diet from your health care provider, if necessary.  Regular physical exercise is one of the most important things you can do for your health. Most adults should get at least 150 minutes of moderate-intensity exercise (any activity that increases your heart rate and causes you to sweat) each week. In addition, most adults need muscle-strengthening exercises on 2 or more days a week.  Maintain a healthy weight. The body mass index (BMI) is a screening tool to identify possible weight problems. It provides an estimate of body fat based on height and weight. Your health care provider can find your BMI and can help you achieve or maintain a healthy weight.For adults 20 years and older:  A BMI below 18.5 is considered underweight.  A BMI of 18.5 to 24.9 is normal.  A BMI of 25 to 29.9 is considered overweight.  A  BMI of 30 and above is considered obese.  Maintain normal blood lipids and cholesterol levels by exercising and minimizing your intake of saturated fat. Eat a balanced diet with plenty of fruit and vegetables. Blood tests for lipids and cholesterol should begin at age 45 and be repeated every 5 years. If your lipid or cholesterol levels are high, you are over 50, or you are at high risk for heart disease, you may need your cholesterol levels checked more frequently.Ongoing high lipid and cholesterol levels should be treated with medicines if diet and exercise are not working.  If you smoke, find out from your health care provider how to quit. If you do not use tobacco, do not start.  Lung cancer screening is recommended for adults aged 45-80 years who are at high risk for developing lung cancer because of a history of smoking. A yearly low-dose CT scan of the lungs is recommended for people who have at least a 30-pack-year history of smoking and are a current smoker or have quit within the past 15 years. A pack year of smoking is smoking an average of 1 pack of cigarettes a day for 1 year (for example: 1 pack a day for 30 years or 2 packs a day for 15 years). Yearly screening should continue until the smoker has stopped smoking for at least 15 years. Yearly screening should be stopped for people who develop a health problem that would prevent them from having lung cancer treatment.  If you are pregnant, do not drink alcohol. If you are  breastfeeding, be very cautious about drinking alcohol. If you are not pregnant and choose to drink alcohol, do not have more than 1 drink per day. One drink is considered to be 12 ounces (355 mL) of beer, 5 ounces (148 mL) of wine, or 1.5 ounces (44 mL) of liquor.  Avoid use of street drugs. Do not share needles with anyone. Ask for help if you need support or instructions about stopping the use of drugs.  High blood pressure causes heart disease and increases the risk  of stroke. Your blood pressure should be checked at least every 1 to 2 years. Ongoing high blood pressure should be treated with medicines if weight loss and exercise do not work.  If you are 55-79 years old, ask your health care provider if you should take aspirin to prevent strokes.  Diabetes screening is done by taking a blood sample to check your blood glucose level after you have not eaten for a certain period of time (fasting). If you are not overweight and you do not have risk factors for diabetes, you should be screened once every 3 years starting at age 45. If you are overweight or obese and you are 40-70 years of age, you should be screened for diabetes every year as part of your cardiovascular risk assessment.  Breast cancer screening is essential preventive care for women. You should practice "breast self-awareness." This means understanding the normal appearance and feel of your breasts and may include breast self-examination. Any changes detected, no matter how small, should be reported to a health care provider. Women in their 20s and 30s should have a clinical breast exam (CBE) by a health care provider as part of a regular health exam every 1 to 3 years. After age 40, women should have a CBE every year. Starting at age 40, women should consider having a mammogram (breast X-ray test) every year. Women who have a family history of breast cancer should talk to their health care provider about genetic screening. Women at a high risk of breast cancer should talk to their health care providers about having an MRI and a mammogram every year.  Breast cancer gene (BRCA)-related cancer risk assessment is recommended for women who have family members with BRCA-related cancers. BRCA-related cancers include breast, ovarian, tubal, and peritoneal cancers. Having family members with these cancers may be associated with an increased risk for harmful changes (mutations) in the breast cancer genes BRCA1 and  BRCA2. Results of the assessment will determine the need for genetic counseling and BRCA1 and BRCA2 testing.  Your health care provider may recommend that you be screened regularly for cancer of the pelvic organs (ovaries, uterus, and vagina). This screening involves a pelvic examination, including checking for microscopic changes to the surface of your cervix (Pap test). You may be encouraged to have this screening done every 3 years, beginning at age 21.  For women ages 30-65, health care providers may recommend pelvic exams and Pap testing every 3 years, or they may recommend the Pap and pelvic exam, combined with testing for human papilloma virus (HPV), every 5 years. Some types of HPV increase your risk of cervical cancer. Testing for HPV may also be done on women of any age with unclear Pap test results.  Other health care providers may not recommend any screening for nonpregnant women who are considered low risk for pelvic cancer and who do not have symptoms. Ask your health care provider if a screening pelvic exam is right for   you.  If you have had past treatment for cervical cancer or a condition that could lead to cancer, you need Pap tests and screening for cancer for at least 20 years after your treatment. If Pap tests have been discontinued, your risk factors (such as having a new sexual partner) need to be reassessed to determine if screening should resume. Some women have medical problems that increase the chance of getting cervical cancer. In these cases, your health care provider may recommend more frequent screening and Pap tests.  Colorectal cancer can be detected and often prevented. Most routine colorectal cancer screening begins at the age of 50 years and continues through age 75 years. However, your health care provider may recommend screening at an earlier age if you have risk factors for colon cancer. On a yearly basis, your health care provider may provide home test kits to check  for hidden blood in the stool. Use of a small camera at the end of a tube, to directly examine the colon (sigmoidoscopy or colonoscopy), can detect the earliest forms of colorectal cancer. Talk to your health care provider about this at age 50, when routine screening begins. Direct exam of the colon should be repeated every 5-10 years through age 75 years, unless early forms of precancerous polyps or small growths are found.  People who are at an increased risk for hepatitis B should be screened for this virus. You are considered at high risk for hepatitis B if:  You were born in a country where hepatitis B occurs often. Talk with your health care provider about which countries are considered high risk.  Your parents were born in a high-risk country and you have not received a shot to protect against hepatitis B (hepatitis B vaccine).  You have HIV or AIDS.  You use needles to inject street drugs.  You live with, or have sex with, someone who has hepatitis B.  You get hemodialysis treatment.  You take certain medicines for conditions like cancer, organ transplantation, and autoimmune conditions.  Hepatitis C blood testing is recommended for all people born from 1945 through 1965 and any individual with known risks for hepatitis C.  Practice safe sex. Use condoms and avoid high-risk sexual practices to reduce the spread of sexually transmitted infections (STIs). STIs include gonorrhea, chlamydia, syphilis, trichomonas, herpes, HPV, and human immunodeficiency virus (HIV). Herpes, HIV, and HPV are viral illnesses that have no cure. They can result in disability, cancer, and death.  You should be screened for sexually transmitted illnesses (STIs) including gonorrhea and chlamydia if:  You are sexually active and are younger than 24 years.  You are older than 24 years and your health care provider tells you that you are at risk for this type of infection.  Your sexual activity has changed  since you were last screened and you are at an increased risk for chlamydia or gonorrhea. Ask your health care provider if you are at risk.  If you are at risk of being infected with HIV, it is recommended that you take a prescription medicine daily to prevent HIV infection. This is called preexposure prophylaxis (PrEP). You are considered at risk if:  You are sexually active and do not regularly use condoms or know the HIV status of your partner(s).  You take drugs by injection.  You are sexually active with a partner who has HIV.  Talk with your health care provider about whether you are at high risk of being infected with HIV. If   you choose to begin PrEP, you should first be tested for HIV. You should then be tested every 3 months for as long as you are taking PrEP.  Osteoporosis is a disease in which the bones lose minerals and strength with aging. This can result in serious bone fractures or breaks. The risk of osteoporosis can be identified using a bone density scan. Women ages 67 years and over and women at risk for fractures or osteoporosis should discuss screening with their health care providers. Ask your health care provider whether you should take a calcium supplement or vitamin D to reduce the rate of osteoporosis.  Menopause can be associated with physical symptoms and risks. Hormone replacement therapy is available to decrease symptoms and risks. You should talk to your health care provider about whether hormone replacement therapy is right for you.  Use sunscreen. Apply sunscreen liberally and repeatedly throughout the day. You should seek shade when your shadow is shorter than you. Protect yourself by wearing long sleeves, pants, a wide-brimmed hat, and sunglasses year round, whenever you are outdoors.  Once a month, do a whole body skin exam, using a mirror to look at the skin on your back. Tell your health care provider of new moles, moles that have irregular borders, moles that  are larger than a pencil eraser, or moles that have changed in shape or color.  Stay current with required vaccines (immunizations).  Influenza vaccine. All adults should be immunized every year.  Tetanus, diphtheria, and acellular pertussis (Td, Tdap) vaccine. Pregnant women should receive 1 dose of Tdap vaccine during each pregnancy. The dose should be obtained regardless of the length of time since the last dose. Immunization is preferred during the 27th-36th week of gestation. An adult who has not previously received Tdap or who does not know her vaccine status should receive 1 dose of Tdap. This initial dose should be followed by tetanus and diphtheria toxoids (Td) booster doses every 10 years. Adults with an unknown or incomplete history of completing a 3-dose immunization series with Td-containing vaccines should begin or complete a primary immunization series including a Tdap dose. Adults should receive a Td booster every 10 years.  Varicella vaccine. An adult without evidence of immunity to varicella should receive 2 doses or a second dose if she has previously received 1 dose. Pregnant females who do not have evidence of immunity should receive the first dose after pregnancy. This first dose should be obtained before leaving the health care facility. The second dose should be obtained 4-8 weeks after the first dose.  Human papillomavirus (HPV) vaccine. Females aged 13-26 years who have not received the vaccine previously should obtain the 3-dose series. The vaccine is not recommended for use in pregnant females. However, pregnancy testing is not needed before receiving a dose. If a female is found to be pregnant after receiving a dose, no treatment is needed. In that case, the remaining doses should be delayed until after the pregnancy. Immunization is recommended for any person with an immunocompromised condition through the age of 61 years if she did not get any or all doses earlier. During the  3-dose series, the second dose should be obtained 4-8 weeks after the first dose. The third dose should be obtained 24 weeks after the first dose and 16 weeks after the second dose.  Zoster vaccine. One dose is recommended for adults aged 30 years or older unless certain conditions are present.  Measles, mumps, and rubella (MMR) vaccine. Adults born  before 1957 generally are considered immune to measles and mumps. Adults born in 1957 or later should have 1 or more doses of MMR vaccine unless there is a contraindication to the vaccine or there is laboratory evidence of immunity to each of the three diseases. A routine second dose of MMR vaccine should be obtained at least 28 days after the first dose for students attending postsecondary schools, health care workers, or international travelers. People who received inactivated measles vaccine or an unknown type of measles vaccine during 1963-1967 should receive 2 doses of MMR vaccine. People who received inactivated mumps vaccine or an unknown type of mumps vaccine before 1979 and are at high risk for mumps infection should consider immunization with 2 doses of MMR vaccine. For females of childbearing age, rubella immunity should be determined. If there is no evidence of immunity, females who are not pregnant should be vaccinated. If there is no evidence of immunity, females who are pregnant should delay immunization until after pregnancy. Unvaccinated health care workers born before 1957 who lack laboratory evidence of measles, mumps, or rubella immunity or laboratory confirmation of disease should consider measles and mumps immunization with 2 doses of MMR vaccine or rubella immunization with 1 dose of MMR vaccine.  Pneumococcal 13-valent conjugate (PCV13) vaccine. When indicated, a person who is uncertain of his immunization history and has no record of immunization should receive the PCV13 vaccine. All adults 65 years of age and older should receive this  vaccine. An adult aged 19 years or older who has certain medical conditions and has not been previously immunized should receive 1 dose of PCV13 vaccine. This PCV13 should be followed with a dose of pneumococcal polysaccharide (PPSV23) vaccine. Adults who are at high risk for pneumococcal disease should obtain the PPSV23 vaccine at least 8 weeks after the dose of PCV13 vaccine. Adults older than 50 years of age who have normal immune system function should obtain the PPSV23 vaccine dose at least 1 year after the dose of PCV13 vaccine.  Pneumococcal polysaccharide (PPSV23) vaccine. When PCV13 is also indicated, PCV13 should be obtained first. All adults aged 65 years and older should be immunized. An adult younger than age 65 years who has certain medical conditions should be immunized. Any person who resides in a nursing home or long-term care facility should be immunized. An adult smoker should be immunized. People with an immunocompromised condition and certain other conditions should receive both PCV13 and PPSV23 vaccines. People with human immunodeficiency virus (HIV) infection should be immunized as soon as possible after diagnosis. Immunization during chemotherapy or radiation therapy should be avoided. Routine use of PPSV23 vaccine is not recommended for American Indians, [place] Natives, or people younger than 65 years unless there are medical conditions that require PPSV23 vaccine. When indicated, people who have unknown immunization and have no record of immunization should receive PPSV23 vaccine. One-time revaccination 5 years after the first dose of PPSV23 is recommended for people aged 19-64 years who have chronic kidney failure, nephrotic syndrome, asplenia, or immunocompromised conditions. People who received 1-2 doses of PPSV23 before age 65 years should receive another dose of PPSV23 vaccine at age 65 years or later if at least 5 years have passed since the previous dose. Doses of PPSV23 are not  needed for people immunized with PPSV23 at or after age 65 years.  Meningococcal vaccine. Adults with asplenia or persistent complement component deficiencies should receive 2 doses of quadrivalent meningococcal conjugate (MenACWY-D) vaccine. The doses should be obtained   at least 2 months apart. Microbiologists working with certain meningococcal bacteria, [place] recruits, people at risk during an outbreak, and people who travel to or live in countries with a high rate of meningitis should be immunized. A first-year college student up through age 34 years who is living in a residence hall should receive a dose if she did not receive a dose on or after her 16th birthday. Adults who have certain high-risk conditions should receive one or more doses of vaccine.  Hepatitis A vaccine. Adults who wish to be protected from this disease, have certain high-risk conditions, work with hepatitis A-infected animals, work in hepatitis A research labs, or travel to or work in countries with a high rate of hepatitis A should be immunized. Adults who were previously unvaccinated and who anticipate close contact with an international adoptee during the first 60 days after arrival in the [place] from a country with a high rate of hepatitis A should be immunized.  Hepatitis B vaccine. Adults who wish to be protected from this disease, have certain high-risk conditions, may be exposed to blood or other infectious body fluids, are household contacts or sex partners of hepatitis B positive people, are clients or workers in certain care facilities, or travel to or work in countries with a high rate of hepatitis B should be immunized.  Haemophilus influenzae type b (Hib) vaccine. A previously unvaccinated person with asplenia or sickle cell disease or having a scheduled splenectomy should receive 1 dose of Hib vaccine. Regardless of previous immunization, a recipient of a hematopoietic stem cell transplant should receive a  3-dose series 6-12 months after her successful transplant. Hib vaccine is not recommended for adults with HIV infection. Preventive Services / Frequency Ages 35 to 4 years  Blood pressure check.** / Every 3-5 years.  Lipid and cholesterol check.** / Every 5 years beginning at age 60.  Clinical breast exam.** / Every 3 years for women in their 71s and 10s.  BRCA-related cancer risk assessment.** / For women who have family members with a BRCA-related cancer (breast, ovarian, tubal, or peritoneal cancers).  Pap test.** / Every 2 years from ages 76 through 26. Every 3 years starting at age 61 through age 76 or 93 with a history of 3 consecutive normal Pap tests.  HPV screening.** / Every 3 years from ages 37 through ages 60 to 51 with a history of 3 consecutive normal Pap tests.  Hepatitis C blood test.** / For any individual with known risks for hepatitis C.  Skin self-exam. / Monthly.  Influenza vaccine. / Every year.  Tetanus, diphtheria, and acellular pertussis (Tdap, Td) vaccine.** / Consult your health care provider. Pregnant women should receive 1 dose of Tdap vaccine during each pregnancy. 1 dose of Td every 10 years.  Varicella vaccine.** / Consult your health care provider. Pregnant females who do not have evidence of immunity should receive the first dose after pregnancy.  HPV vaccine. / 3 doses over 6 months, if 93 and younger. The vaccine is not recommended for use in pregnant females. However, pregnancy testing is not needed before receiving a dose.  Measles, mumps, rubella (MMR) vaccine.** / You need at least 1 dose of MMR if you were born in 1957 or later. You may also need a 2nd dose. For females of childbearing age, rubella immunity should be determined. If there is no evidence of immunity, females who are not pregnant should be vaccinated. If there is no evidence of immunity, females who are  pregnant should delay immunization until after pregnancy.  Pneumococcal  13-valent conjugate (PCV13) vaccine.** / Consult your health care provider.  Pneumococcal polysaccharide (PPSV23) vaccine.** / 1 to 2 doses if you smoke cigarettes or if you have certain conditions.  Meningococcal vaccine.** / 1 dose if you are age 68 to 8 years and a Market researcher living in a residence hall, or have one of several medical conditions, you need to get vaccinated against meningococcal disease. You may also need additional booster doses.  Hepatitis A vaccine.** / Consult your health care provider.  Hepatitis B vaccine.** / Consult your health care provider.  Haemophilus influenzae type b (Hib) vaccine.** / Consult your health care provider. Ages 7 to 53 years  Blood pressure check.** / Every year.  Lipid and cholesterol check.** / Every 5 years beginning at age 25 years.  Lung cancer screening. / Every year if you are aged 11-80 years and have a 30-pack-year history of smoking and currently smoke or have quit within the past 15 years. Yearly screening is stopped once you have quit smoking for at least 15 years or develop a health problem that would prevent you from having lung cancer treatment.  Clinical breast exam.** / Every year after age 48 years.  BRCA-related cancer risk assessment.** / For women who have family members with a BRCA-related cancer (breast, ovarian, tubal, or peritoneal cancers).  Mammogram.** / Every year beginning at age 41 years and continuing for as long as you are in good health. Consult with your health care provider.  Pap test.** / Every 3 years starting at age 65 years through age 37 or 70 years with a history of 3 consecutive normal Pap tests.  HPV screening.** / Every 3 years from ages 72 years through ages 60 to 40 years with a history of 3 consecutive normal Pap tests.  Fecal occult blood test (FOBT) of stool. / Every year beginning at age 21 years and continuing until age 5 years. You may not need to do this test if you get  a colonoscopy every 10 years.  Flexible sigmoidoscopy or colonoscopy.** / Every 5 years for a flexible sigmoidoscopy or every 10 years for a colonoscopy beginning at age 35 years and continuing until age 48 years.  Hepatitis C blood test.** / For all people born from [birth year] through 1965 and any individual with known risks for hepatitis C.  Skin self-exam. / Monthly.  Influenza vaccine. / Every year.  Tetanus, diphtheria, and acellular pertussis (Tdap/Td) vaccine.** / Consult your health care provider. Pregnant women should receive 1 dose of Tdap vaccine during each pregnancy. 1 dose of Td every 10 years.  Varicella vaccine.** / Consult your health care provider. Pregnant females who do not have evidence of immunity should receive the first dose after pregnancy.  Zoster vaccine.** / 1 dose for adults aged 30 years or older.  Measles, mumps, rubella (MMR) vaccine.** / You need at least 1 dose of MMR if you were born in 1957 or later. You may also need a second dose. For females of childbearing age, rubella immunity should be determined. If there is no evidence of immunity, females who are not pregnant should be vaccinated. If there is no evidence of immunity, females who are pregnant should delay immunization until after pregnancy.  Pneumococcal 13-valent conjugate (PCV13) vaccine.** / Consult your health care provider.  Pneumococcal polysaccharide (PPSV23) vaccine.** / 1 to 2 doses if you smoke cigarettes or if you have certain conditions.  Meningococcal vaccine.** /  Consult your health care provider.  Hepatitis A vaccine.** / Consult your health care provider.  Hepatitis B vaccine.** / Consult your health care provider.  Haemophilus influenzae type b (Hib) vaccine.** / Consult your health care provider. Ages 64 years and over  Blood pressure check.** / Every year.  Lipid and cholesterol check.** / Every 5 years beginning at age 23 years.  Lung cancer screening. / Every year if you  are aged 16-80 years and have a 30-pack-year history of smoking and currently smoke or have quit within the past 15 years. Yearly screening is stopped once you have quit smoking for at least 15 years or develop a health problem that would prevent you from having lung cancer treatment.  Clinical breast exam.** / Every year after age 74 years.  BRCA-related cancer risk assessment.** / For women who have family members with a BRCA-related cancer (breast, ovarian, tubal, or peritoneal cancers).  Mammogram.** / Every year beginning at age 44 years and continuing for as long as you are in good health. Consult with your health care provider.  Pap test.** / Every 3 years starting at age 58 years through age 22 or 39 years with 3 consecutive normal Pap tests. Testing can be stopped between 65 and 70 years with 3 consecutive normal Pap tests and no abnormal Pap or HPV tests in the past 10 years.  HPV screening.** / Every 3 years from ages 64 years through ages 70 or 61 years with a history of 3 consecutive normal Pap tests. Testing can be stopped between 65 and 70 years with 3 consecutive normal Pap tests and no abnormal Pap or HPV tests in the past 10 years.  Fecal occult blood test (FOBT) of stool. / Every year beginning at age 40 years and continuing until age 27 years. You may not need to do this test if you get a colonoscopy every 10 years.  Flexible sigmoidoscopy or colonoscopy.** / Every 5 years for a flexible sigmoidoscopy or every 10 years for a colonoscopy beginning at age 7 years and continuing until age 32 years.  Hepatitis C blood test.** / For all people born from [birth year] through 1965 and any individual with known risks for hepatitis C.  Osteoporosis screening.** / A one-time screening for women ages 30 years and over and women at risk for fractures or osteoporosis.  Skin self-exam. / Monthly.  Influenza vaccine. / Every year.  Tetanus, diphtheria, and acellular pertussis (Tdap/Td)  vaccine.** / 1 dose of Td every 10 years.  Varicella vaccine.** / Consult your health care provider.  Zoster vaccine.** / 1 dose for adults aged 35 years or older.  Pneumococcal 13-valent conjugate (PCV13) vaccine.** / Consult your health care provider.  Pneumococcal polysaccharide (PPSV23) vaccine.** / 1 dose for all adults aged 46 years and older.  Meningococcal vaccine.** / Consult your health care provider.  Hepatitis A vaccine.** / Consult your health care provider.  Hepatitis B vaccine.** / Consult your health care provider.  Haemophilus influenzae type b (Hib) vaccine.** / Consult your health care provider. ** Family history and personal history of risk and conditions may change your health care provider's recommendations.   This information is not intended to replace advice given to you by your health care provider. Make sure you discuss any questions you have with your health care provider.   Document Released: 05/08/2001 Document Revised: 04/02/2014 Document Reviewed: 08/07/2010 Elsevier Interactive Patient Education [employer].

## 2016-02-01 LAB — CYTOLOGY - PAP
Diagnosis: NEGATIVE
HPV: NOT DETECTED

## 2016-02-03 ENCOUNTER — Other Ambulatory Visit (INDEPENDENT_AMBULATORY_CARE_PROVIDER_SITE_OTHER): Payer: BC Managed Care – PPO

## 2016-02-03 DIAGNOSIS — Z0000 Encounter for general adult medical examination without abnormal findings: Secondary | ICD-10-CM | POA: Diagnosis not present

## 2016-02-03 LAB — CBC WITH DIFFERENTIAL/PLATELET
BASOS ABS: 0 10*3/uL (ref 0.0–0.1)
Basophils Relative: 0.3 % (ref 0.0–3.0)
EOS PCT: 0.1 % (ref 0.0–5.0)
Eosinophils Absolute: 0 10*3/uL (ref 0.0–0.7)
HEMATOCRIT: 33.1 % — AB (ref 36.0–46.0)
HEMOGLOBIN: 10.5 g/dL — AB (ref 12.0–15.0)
LYMPHS PCT: 21.2 % (ref 12.0–46.0)
Lymphs Abs: 1.3 10*3/uL (ref 0.7–4.0)
MCHC: 31.8 g/dL (ref 30.0–36.0)
MCV: 68.6 fl — AB (ref 78.0–100.0)
MONOS PCT: 6.6 % (ref 3.0–12.0)
Monocytes Absolute: 0.4 10*3/uL (ref 0.1–1.0)
NEUTROS PCT: 71.8 % (ref 43.0–77.0)
Neutro Abs: 4.4 10*3/uL (ref 1.4–7.7)
Platelets: 294 10*3/uL (ref 150.0–400.0)
RBC: 4.82 Mil/uL (ref 3.87–5.11)
RDW: 18 % — ABNORMAL HIGH (ref 11.5–15.5)
WBC: 6.1 10*3/uL (ref 4.0–10.5)

## 2016-02-03 LAB — POCT URINALYSIS DIPSTICK
Bilirubin, UA: NEGATIVE
GLUCOSE UA: NEGATIVE
Ketones, UA: NEGATIVE
LEUKOCYTES UA: NEGATIVE
NITRITE UA: NEGATIVE
PH UA: 6
Protein, UA: NEGATIVE
RBC UA: NEGATIVE
Spec Grav, UA: >=1.03
UROBILINOGEN UA: 0.2

## 2016-02-03 LAB — COMPREHENSIVE METABOLIC PANEL
ALK PHOS: 69 U/L (ref 39–117)
ALT: 14 U/L (ref 0–35)
AST: 14 U/L (ref 0–37)
Albumin: 4 g/dL (ref 3.5–5.2)
BUN: 11 mg/dL (ref 6–23)
CHLORIDE: 103 meq/L (ref 96–112)
CO2: 28 mEq/L (ref 19–32)
Calcium: 9.6 mg/dL (ref 8.4–10.5)
Creatinine, Ser: 0.69 mg/dL (ref 0.40–1.20)
GFR: 95.73 mL/min (ref >60.00)
GLUCOSE: 127 mg/dL — AB (ref 70–99)
POTASSIUM: 4.3 meq/L (ref 3.5–5.1)
Sodium: 139 mEq/L (ref 135–145)
TOTAL PROTEIN: 7.3 g/dL (ref 6.0–8.3)
Total Bilirubin: 0.5 mg/dL (ref 0.2–1.2)

## 2016-02-03 LAB — LIPID PANEL
CHOL/HDL RATIO: 4
Cholesterol: 198 mg/dL (ref 0–200)
HDL: 46.7 mg/dL (ref >39.00)
LDL CALC: 136 mg/dL — AB (ref 0–99)
NONHDL: 151.42
Triglycerides: 78 mg/dL (ref 0.0–149.0)
VLDL: 15.6 mg/dL (ref 0.0–40.0)

## 2016-02-03 LAB — TSH: TSH: 2.45 u[IU]/mL (ref 0.35–4.50)

## 2016-02-13 ENCOUNTER — Telehealth: Payer: Self-pay

## 2016-02-13 NOTE — Telephone Encounter (Signed)
Pt returned call, spoke with pt, pt states that she understand results, medication instructions, and follow up appointment with lab. Pt states she will call with appointment to recheck lab. Pt had no further questions or concerns. LB

## 2016-02-13 NOTE — Telephone Encounter (Signed)
-----   Message from Ann Held, DO sent at 02/09/2016  8:50 PM EST ----- Anemic--- check hemeoccult cards----- they will need to be sent to pt Take multivitamin with iron daily Cholesterol--- LDL goal < 100,  HDL >40,  TG < 150.  Diet and exercise will increase HDL and decrease LDL and TG.  Fish,  Fish Oil, Flaxseed oil will also help increase the HDL and decrease Triglycerides.   Recheck labs in 3 months and glucose is elevated--- watch simple sugars and starches .

## 2016-03-09 ENCOUNTER — Other Ambulatory Visit: Payer: Self-pay | Admitting: Emergency Medicine

## 2016-03-09 ENCOUNTER — Other Ambulatory Visit (INDEPENDENT_AMBULATORY_CARE_PROVIDER_SITE_OTHER): Payer: BC Managed Care – PPO

## 2016-03-09 DIAGNOSIS — R197 Diarrhea, unspecified: Secondary | ICD-10-CM

## 2016-03-09 LAB — FECAL OCCULT BLOOD, IMMUNOCHEMICAL: Fecal Occult Bld: NEGATIVE

## 2016-06-06 ENCOUNTER — Encounter: Payer: Self-pay | Admitting: Family Medicine

## 2016-06-07 ENCOUNTER — Other Ambulatory Visit: Payer: Self-pay | Admitting: Family Medicine

## 2016-06-07 DIAGNOSIS — M79671 Pain in right foot: Secondary | ICD-10-CM

## 2016-06-07 NOTE — Telephone Encounter (Signed)
Ok to refer to podiatry 

## 2016-06-09 ENCOUNTER — Other Ambulatory Visit: Payer: Self-pay | Admitting: Family Medicine

## 2016-06-09 DIAGNOSIS — F411 Generalized anxiety disorder: Secondary | ICD-10-CM

## 2016-06-09 DIAGNOSIS — K219 Gastro-esophageal reflux disease without esophagitis: Secondary | ICD-10-CM

## 2016-06-11 NOTE — Telephone Encounter (Signed)
Requesting:  alprazolam Contract    None UDS    None Last OV   01/30/2016---next ov is 02/01/17 Last Refill   #30 with 0 refills on 01/30/2016  Please Advise

## 2016-06-11 NOTE — Telephone Encounter (Signed)
Faxed hardcopy for Alprazolam to [facility].

## 2016-06-27 ENCOUNTER — Ambulatory Visit (INDEPENDENT_AMBULATORY_CARE_PROVIDER_SITE_OTHER): Payer: BC Managed Care – PPO | Admitting: Podiatry

## 2016-06-27 ENCOUNTER — Encounter: Payer: Self-pay | Admitting: Podiatry

## 2016-06-27 ENCOUNTER — Ambulatory Visit (INDEPENDENT_AMBULATORY_CARE_PROVIDER_SITE_OTHER): Payer: BC Managed Care – PPO

## 2016-06-27 VITALS — BP 125/81 | HR 100 | Resp 16 | Ht 67.0 in | Wt 210.0 lb

## 2016-06-27 DIAGNOSIS — M79671 Pain in right foot: Secondary | ICD-10-CM

## 2016-06-27 DIAGNOSIS — M7752 Other enthesopathy of left foot: Secondary | ICD-10-CM | POA: Diagnosis not present

## 2016-06-27 DIAGNOSIS — M7732 Calcaneal spur, left foot: Secondary | ICD-10-CM | POA: Diagnosis not present

## 2016-06-27 DIAGNOSIS — M7662 Achilles tendinitis, left leg: Secondary | ICD-10-CM

## 2016-06-27 NOTE — Progress Notes (Signed)
   Subjective:    Patient ID: Kelli Evans, female    DOB: 02-Oct-1965, 51 y.o.   MRN: [id number]  HPI    Review of Systems  Musculoskeletal: Positive for arthralgias and gait problem.  All other systems reviewed and are negative.      Objective:   Physical Exam        Assessment & Plan:

## 2016-06-27 NOTE — Progress Notes (Signed)
   Subjective:    Patient ID: Kelli Evans, female    DOB: [birth year], 51 y.o.   MRN: [id number]  HPI    Review of Systems     Objective:   Physical Exam        Assessment & Plan:

## 2016-06-30 NOTE — Progress Notes (Signed)
Patient ID: Kelli Evans, female   DOB: 05-Sep-1965, 51 y.o.   MRN: [id number]   Subjective: Patient is a 51 year old female presenting as a new patient for evaluation of a nodule to the right lateral posterior heel that has been present for approximately 1 year. She states her doctor told her that she had plantar fasciitis. She wraps her foot with an Ace wrap and has inserts from [employer] which helps to alleviate her pain. Exercise and walking her dog increases her pain area. She denies any trauma. 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    Objective/Physical Exam General: The patient is alert and oriented x3 in no acute distress.  Dermatology: Skin is warm, dry and supple bilateral lower extremities. Negative for open lesions or macerations.  Vascular: Palpable pedal pulses bilaterally. No edema or erythema noted. Capillary refill within normal limits.  Neurological: Epicritic and protective threshold grossly intact bilaterally.   Musculoskeletal Exam: Range of motion within normal limits to all pedal and ankle joints bilateral. Muscle strength 5/5 in all groups bilateral.      Assessment: #1 left posterior heel spur #2 retrocalcaneal bursitis-left #3 Achilles tendinitis-left   Plan of Care:  #1 Patient was evaluated. #2 injection of 0.5 mL of Celestone Soluspan into the retrocalcaneal bursa left foot. Care was taken to avoid direct injection into the Achilles tendon. 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 Edrick Kins, [address]                                        [place], [place] [address]                Office [phone]  Fax [phone]

## 2016-07-04 MED ORDER — BETAMETHASONE SOD PHOS & ACET 6 (3-3) MG/ML IJ SUSP: 3.0000 mg | Freq: Once | INTRAMUSCULAR | Status: DC

## 2016-07-11 ENCOUNTER — Other Ambulatory Visit: Payer: Self-pay | Admitting: Family Medicine

## 2016-08-16 ENCOUNTER — Encounter: Payer: Self-pay | Admitting: Podiatry

## 2016-09-03 ENCOUNTER — Encounter: Payer: Self-pay | Admitting: Podiatry

## 2016-09-03 ENCOUNTER — Ambulatory Visit (INDEPENDENT_AMBULATORY_CARE_PROVIDER_SITE_OTHER): Payer: BC Managed Care – PPO | Admitting: Podiatry

## 2016-09-03 DIAGNOSIS — M7751 Other enthesopathy of right foot: Secondary | ICD-10-CM | POA: Diagnosis not present

## 2016-09-03 DIAGNOSIS — M7661 Achilles tendinitis, right leg: Secondary | ICD-10-CM

## 2016-09-12 NOTE — Progress Notes (Signed)
Patient ID: Kelli Evans, female   DOB: January 28, 1966, 51 y.o.   MRN: [id number]   Subjective: Patient is a 51 year old female presenting for follow-up evaluation of right foot pain. She states her pain has improved somewhat. She states she is leaving for [place] in a few days and wants injections. She states the injections at her last visit helped for approximately 6 weeks. She has no new complaints today. She is here for further evaluation and treatment.   Objective/Physical Exam General: The patient is alert and oriented x3 in no acute distress.  Dermatology: Skin is warm, dry and supple bilateral lower extremities. Negative for open lesions or macerations.  Vascular: Palpable pedal pulses bilaterally. No edema or erythema noted. Capillary refill within normal limits.  Neurological: Epicritic and protective threshold grossly intact bilaterally.   Musculoskeletal Exam: Range of motion within normal limits to all pedal and ankle joints bilateral. Muscle strength 5/5 in all groups bilateral.      Assessment: #1 right posterior heel spur #2 retrocalcaneal bursitis-right #3 Achilles tendinitis-right   Plan of Care:  #1 Patient was evaluated. #2 injection of 0.5 mL of Celestone Soluspan into the retrocalcaneal bursa left foot. Care was taken to avoid direct injection into the Achilles tendon. #3 continue wearing good shoe gear. #4 return to clinic when necessary.  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  Edrick Kins, DPM [facility]  Dr. Edrick Kins, [address]                                        [place], [place] [address]                Office [phone]  Fax [phone]

## 2016-09-17 MED ORDER — BETAMETHASONE SOD PHOS & ACET 6 (3-3) MG/ML IJ SUSP: 3.0000 mg | Freq: Once | INTRAMUSCULAR | Status: DC

## 2016-10-10 ENCOUNTER — Other Ambulatory Visit: Payer: Self-pay | Admitting: Family Medicine

## 2016-12-19 ENCOUNTER — Encounter: Payer: Self-pay | Admitting: Family Medicine

## 2016-12-19 ENCOUNTER — Ambulatory Visit (INDEPENDENT_AMBULATORY_CARE_PROVIDER_SITE_OTHER): Payer: BC Managed Care – PPO | Admitting: Family Medicine

## 2016-12-19 VITALS — BP 125/82 | HR 82 | Temp 97.9°F | Ht 67.0 in | Wt 218.0 lb

## 2016-12-19 DIAGNOSIS — Z0000 Encounter for general adult medical examination without abnormal findings: Secondary | ICD-10-CM | POA: Diagnosis not present

## 2016-12-19 DIAGNOSIS — R21 Rash and other nonspecific skin eruption: Secondary | ICD-10-CM | POA: Diagnosis not present

## 2016-12-19 MED ORDER — PREDNISONE 10 MG PO TABS: ORAL_TABLET | ORAL | 0 refills | Status: DC

## 2016-12-19 NOTE — Patient Instructions (Signed)
Rash A rash is a change in the color of the skin. A rash can also change the way your skin feels. There are many different conditions and factors that can cause a rash. Follow these instructions at home: Pay attention to any changes in your symptoms. Follow these instructions to help with your condition: Medicine Take or apply over-the-counter and prescription medicines only as told by your health care provider. These may include:  Corticosteroid cream.  Anti-itch lotions.  Oral antihistamines.  Skin Care  Apply cool compresses to the affected areas.  Try taking a bath with: ? Epsom salts. Follow the instructions on the packaging. You can get these at your local pharmacy or grocery store. ? Baking soda. Pour a small amount into the bath as told by your health care provider. ? Colloidal oatmeal. Follow the instructions on the packaging. You can get this at your local pharmacy or grocery store.  Try applying baking soda paste to your skin. Stir water into baking soda until it reaches a paste-like consistency.  Do not scratch or rub your skin.  Avoid covering the rash. Make sure the rash is exposed to air as much as possible. General instructions  Avoid hot showers or baths, which can make itching worse. A cold shower may help.  Avoid scented soaps, detergents, and perfumes. Use gentle soaps, detergents, perfumes, and other cosmetic products.  Avoid any substance that causes your rash. Keep a journal to help track what causes your rash. Write down: ? What you eat. ? What cosmetic products you use. ? What you drink. ? What you wear. This includes jewelry.  Keep all follow-up visits as told by your health care provider. This is important. Contact a health care provider if:  You sweat at night.  You lose weight.  You urinate more than normal.  You feel weak.  You vomit.  Your skin or the whites of your eyes look yellow (jaundice).  Your skin: ? Tingles. ? Is  numb.  Your rash: ? Does not go away after several days. ? Gets worse.  You are: ? Unusually thirsty. ? More tired than normal.  You have: ? New symptoms. ? Pain in your abdomen. ? A fever. ? Diarrhea. Get help right away if:  You develop a rash that covers all or most of your body. The rash may or may not be painful.  You develop blisters that: ? Are on top of the rash. ? Grow larger or grow together. ? Are painful. ? Are inside your nose or mouth.  You develop a rash that: ? Looks like purple pinprick-sized spots all over your body. ? Has a "bull's eye" or looks like a target. ? Is not related to sun exposure, is red and painful, and causes your skin to peel. This information is not intended to replace advice given to you by your health care provider. Make sure you discuss any questions you have with your health care provider. Document Released: 03/02/2002 Document Revised: 08/16/2015 Document Reviewed: 07/28/2014 Elsevier Interactive Patient Education  2017 Elsevier Inc.  

## 2016-12-19 NOTE — Progress Notes (Signed)
Patient ID: Kelli Evans, female    DOB: 1965/09/14  Age: 51 y.o. MRN: [id number]    Subjective:  Subjective  HPI Kelli Evans presents for rash / itching that started yesterday about 3 pm   No new soaps, detergents, lotions etc.  She has not been working outside  Has not travelled anywhere.    Review of Systems  Constitutional: Negative for activity change, appetite change, fatigue and unexpected weight change.  Respiratory: Negative for cough and shortness of breath.   Cardiovascular: Negative for chest pain and palpitations.  Skin: Positive for rash.  Psychiatric/Behavioral: Negative for behavioral problems and dysphoric mood. The patient is not nervous/anxious.     History Past Medical History:  Diagnosis Date  . GERD (gastroesophageal reflux disease)   . Hyperlipidemia   . Hypothyroidism     She has no past surgical history on file.   Her family history includes Breast cancer in her maternal grandfather and mother; Cancer in her daughter and father; Cancer (age of onset: 59) in her cousin; Cancer (age of onset: 42) in her mother; Heart disease in her father; Hyperlipidemia in her father; Hypertension in her father; Hypothyroidism in her father.She reports that she has never smoked. She has never used smokeless tobacco. She reports that she drinks alcohol. She reports that she does not use drugs.  Current Outpatient Prescriptions on File Prior to Visit  Medication Sig Dispense Refill  . ALPRAZolam (XANAX) 0.25 MG tablet TAKE 1 TABLET BY MOUTH 3 TIMES A DAY AS NEEDED 30 tablet 1  . fluticasone (FLONASE) 50 MCG/ACT nasal spray Place 2 sprays into both nostrils daily. 16 g 6  . loratadine (CLARITIN) 10 MG tablet Take 1 tablet (10 mg total) by mouth daily. 30 tablet 11  . omeprazole (PRILOSEC) 20 MG capsule TAKE ONE CAPSULE BY MOUTH DAILY 30 capsule 6  . spironolactone (ALDACTONE) 25 MG tablet Take 25 mg by mouth 2 (two) times daily.   3  . venlafaxine XR (EFFEXOR-XR) 150 MG 24  hr capsule TAKE 1 CAPSULE (150 MG TOTAL) BY MOUTH DAILY. 90 capsule 0   Current Facility-Administered Medications on File Prior to Visit  Medication Dose Route Frequency Provider Last Rate Last Dose  . betamethasone acetate-betamethasone sodium phosphate (CELESTONE) injection 3 mg  3 mg Intramuscular Once Daylene Katayama M, DPM      . betamethasone acetate-betamethasone sodium phosphate (CELESTONE) injection 3 mg  3 mg Intramuscular Once Edrick Kins, DPM         Objective:  Objective  Physical Exam  Constitutional: She is oriented to person, place, and time. She appears well-developed and well-nourished.  HENT:  Head: Normocephalic and atraumatic.  Eyes: Conjunctivae and EOM are normal.  Neck: Normal range of motion. Neck supple. No JVD present. Carotid bruit is not present. No thyromegaly present.  Cardiovascular: Normal rate, regular rhythm and normal heart sounds.   No murmur heard. Pulmonary/Chest: Effort normal and breath sounds normal. No respiratory distress. She has no wheezes. She has no rales. She exhibits no tenderness.  Musculoskeletal: She exhibits no edema.  Neurological: She is alert and oriented to person, place, and time.  Skin: Rash noted. Rash is macular and urticarial.     Psychiatric: She has a normal mood and affect.  Nursing note and vitals reviewed.  BP 125/82   Pulse 82   Temp 97.9 F (36.6 C) (Oral)   Ht 5\' 7"  (1.702 m)   Wt 218 lb (98.9 kg)   LMP 12/07/2016 (  Approximate)   SpO2 98%   BMI 34.14 kg/m  Wt Readings from Last 3 Encounters:  12/19/16 218 lb (98.9 kg)  06/27/16 210 lb (95.3 kg)  01/30/16 212 lb 12.8 oz (96.5 kg)     Lab Results  Component Value Date   WBC 6.1 02/03/2016   HGB 10.5 (L) 02/03/2016   HCT 33.1 (L) 02/03/2016   PLT 294.0 02/03/2016   GLUCOSE 127 (H) 02/03/2016   CHOL 198 02/03/2016   TRIG 78.0 02/03/2016   HDL 46.70 02/03/2016   LDLDIRECT 151.6 10/31/2012   LDLCALC 136 (H) 02/03/2016   ALT 14 02/03/2016   AST 14  02/03/2016   NA 139 02/03/2016   K 4.3 02/03/2016   CL 103 02/03/2016   CREATININE 0.69 02/03/2016   BUN 11 02/03/2016   CO2 28 02/03/2016   TSH 2.45 02/03/2016    No results found.   Assessment & Plan:  Plan  I am having Kelli Evans start on predniSONE. I am also having her maintain her fluticasone, loratadine, omeprazole, ALPRAZolam, spironolactone, and venlafaxine XR. We will continue to administer betamethasone acetate-betamethasone sodium phosphate and betamethasone acetate-betamethasone sodium phosphate.  Meds ordered this encounter  Medications  . predniSONE (DELTASONE) 10 MG tablet    Sig: TAKE 3 TABLETS PO QD FOR 3 DAYS THEN TAKE 2 TABLETS PO QD FOR 3 DAYS THEN TAKE 1 TABLET PO QD FOR 3 DAYS THEN TAKE 1/2 TAB PO QD FOR 3 DAYS    Dispense:  20 tablet    Refill:  0    Problem List Items Addressed This Visit      Unprioritized   Rash - Primary    ? etiology  Depo medrol 80 mg IM pred taper otc antihistamine  Will consider bx vs derm if no better      Relevant Medications   predniSONE (DELTASONE) 10 MG tablet    Other Visit Diagnoses    Preventative health care       Relevant Orders   Ambulatory referral to Gastroenterology      Follow-up: Return if symptoms worsen or fail to improve.  Ann Held, DO

## 2016-12-19 NOTE — Assessment & Plan Note (Signed)
?   etiology  Depo medrol 80 mg IM pred taper otc antihistamine  Will consider bx vs derm if no better

## 2016-12-20 MED ORDER — METHYLPREDNISOLONE ACETATE 40 MG/ML IJ SUSP
40.0000 mg | Freq: Once | INTRAMUSCULAR | Status: AC
  Administered 2016-12-19: 40 mg via INTRAMUSCULAR

## 2016-12-20 NOTE — Addendum Note (Signed)
Addended by: Roma Kayser on: 12/20/2016 10:24 AM   Modules accepted: Orders

## 2016-12-21 ENCOUNTER — Other Ambulatory Visit: Payer: Self-pay | Admitting: Family Medicine

## 2016-12-21 DIAGNOSIS — J019 Acute sinusitis, unspecified: Secondary | ICD-10-CM

## 2017-01-06 ENCOUNTER — Other Ambulatory Visit: Payer: Self-pay | Admitting: Family Medicine

## 2017-01-06 DIAGNOSIS — K219 Gastro-esophageal reflux disease without esophagitis: Secondary | ICD-10-CM

## 2017-01-18 ENCOUNTER — Encounter: Payer: Self-pay | Admitting: Family Medicine

## 2017-02-01 ENCOUNTER — Encounter: Payer: BC Managed Care – PPO | Admitting: Family Medicine

## 2017-02-12 IMAGING — MG MM SCREENING BREAST TOMO BILATERAL
8 series · 8 of 24 positions shown · non-contrast
Comparison: Previous exam(s).

CLINICAL DATA: Screening.

EXAM:
DIGITAL SCREENING BILATERAL MAMMOGRAM WITH 3D TOMO WITH CAD

[L CC]
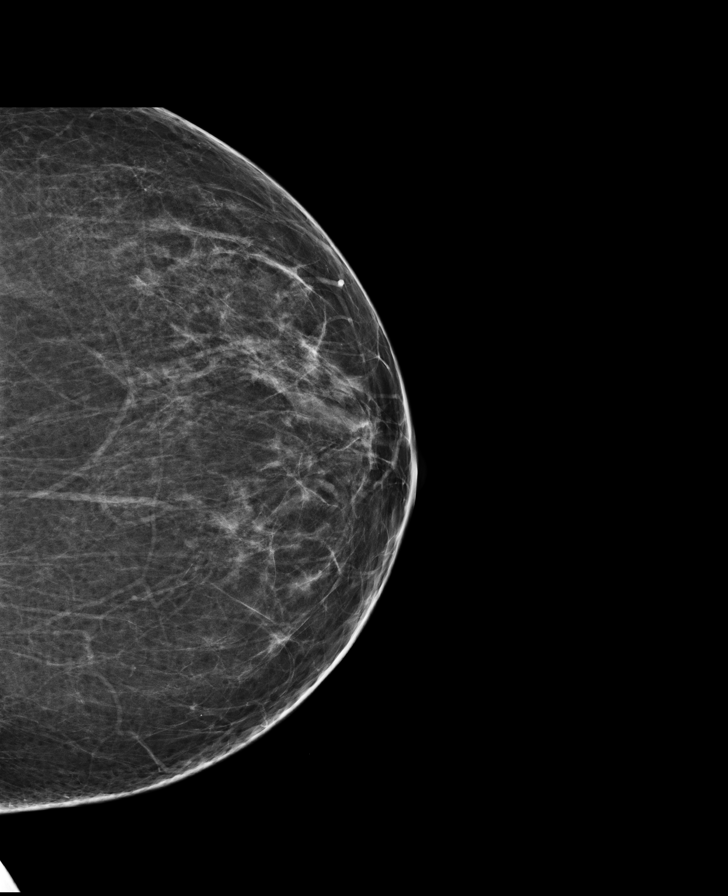

[R CC]
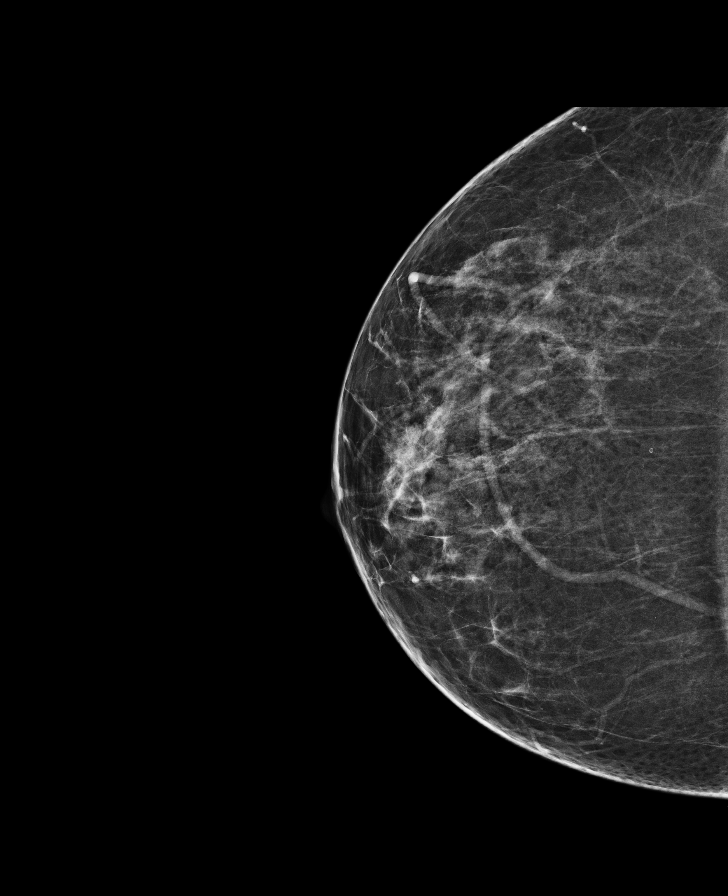

[R MLO]
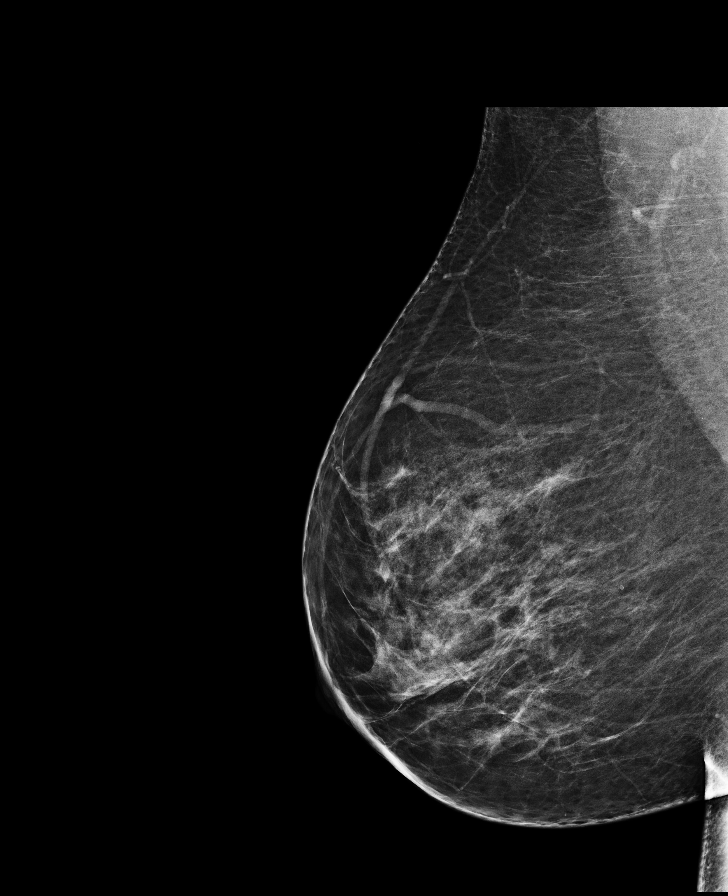

[L MLO]
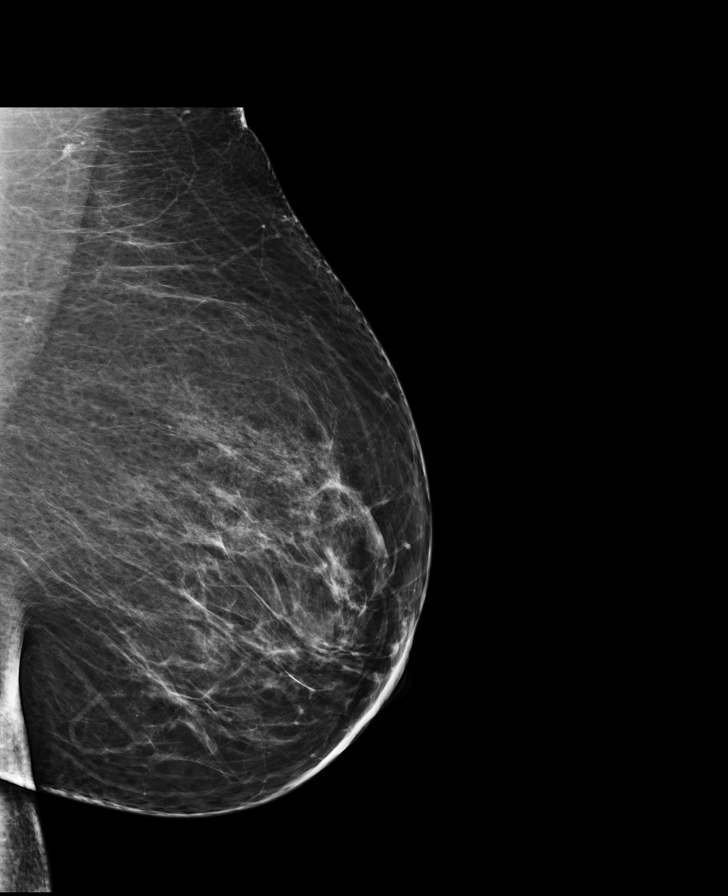

[L MLO tomo · tomo slice 48/95.0]
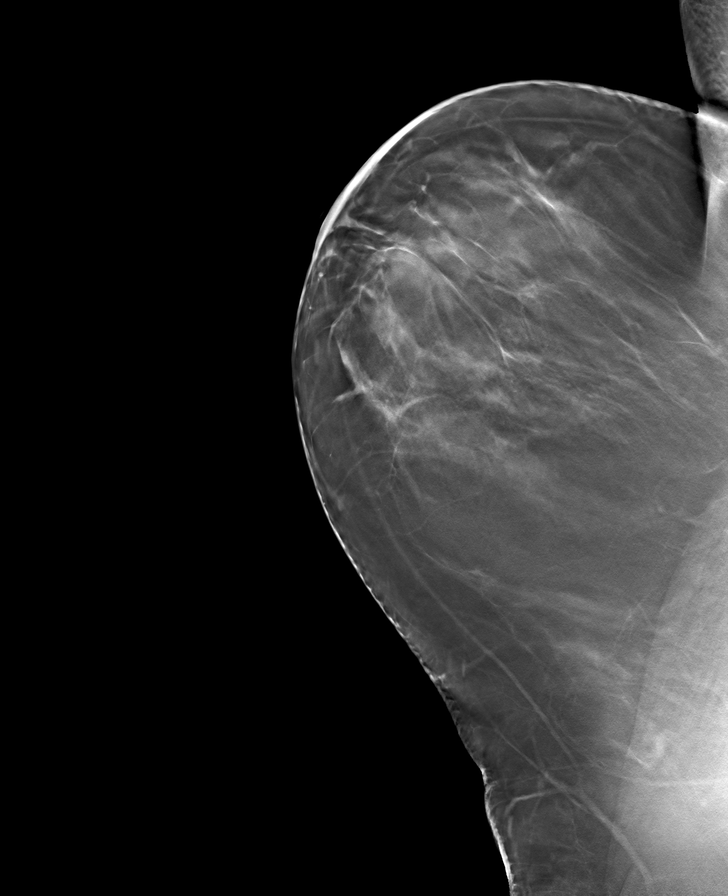

[R MLO tomo · tomo slice 46/91.0]
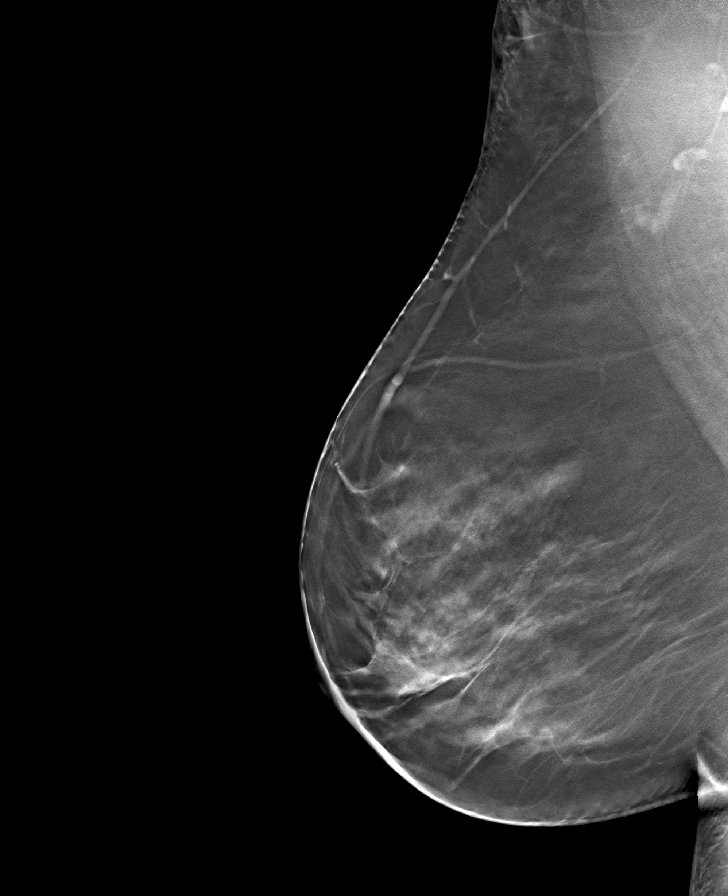

[L CC tomo · tomo slice 39/76.0]
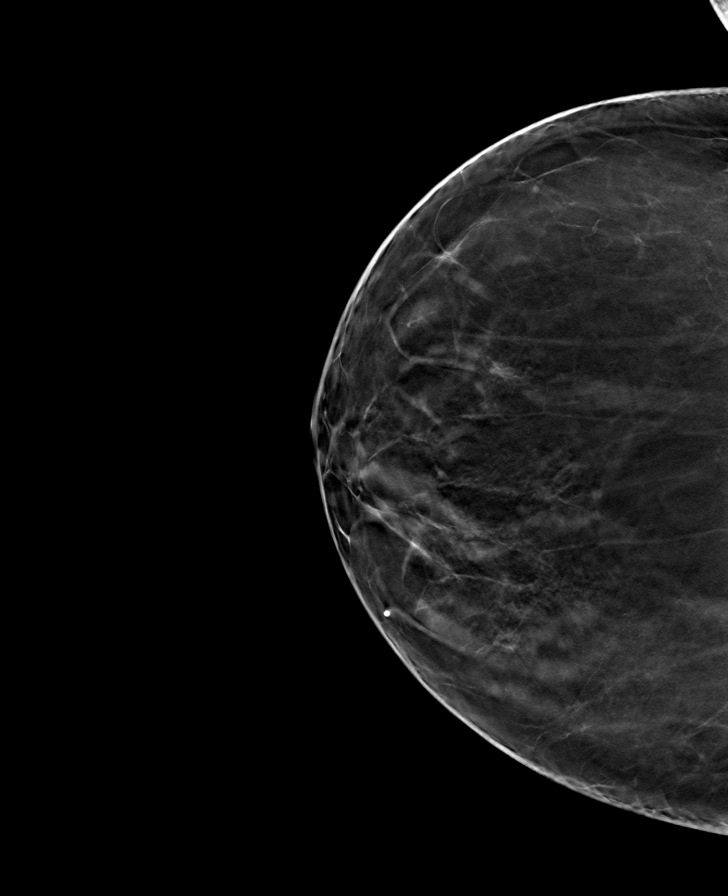

[R CC tomo · tomo slice 39/78.0]
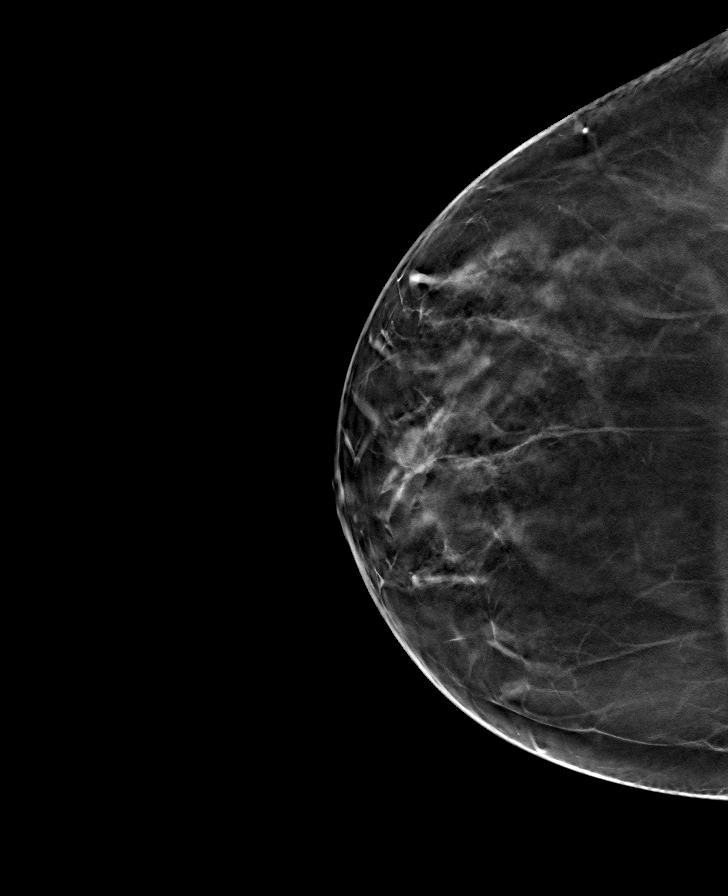

[8 of 24 positions shown; findings below may reference images not displayed]

ACR Breast Density Category b: There are scattered areas of
fibroglandular density.
FINDINGS: There are no findings suspicious for malignancy. Images were
processed with CAD.
IMPRESSION: No mammographic evidence of malignancy. A result letter of this
screening mammogram will be mailed directly to the patient.

RECOMMENDATION:
Screening mammogram in one year. (Code:55-L-23V)

BI-RADS CATEGORY  1: Negative.

## 2017-02-25 ENCOUNTER — Ambulatory Visit (HOSPITAL_COMMUNITY)
Admission: EM | Admit: 2017-02-25 | Discharge: 2017-02-25 | Disposition: A | Discharge status: Home / Self Care | Payer: BC Managed Care – PPO | Attending: Internal Medicine | Admitting: Internal Medicine

## 2017-02-25 DIAGNOSIS — F329 Major depressive disorder, single episode, unspecified: Secondary | ICD-10-CM | POA: Insufficient documentation

## 2017-02-25 DIAGNOSIS — E039 Hypothyroidism, unspecified: Secondary | ICD-10-CM | POA: Diagnosis not present

## 2017-02-25 DIAGNOSIS — R21 Rash and other nonspecific skin eruption: Secondary | ICD-10-CM | POA: Diagnosis not present

## 2017-02-25 DIAGNOSIS — M722 Plantar fascial fibromatosis: Secondary | ICD-10-CM | POA: Diagnosis not present

## 2017-02-25 DIAGNOSIS — N3000 Acute cystitis without hematuria: Secondary | ICD-10-CM

## 2017-02-25 DIAGNOSIS — E669 Obesity, unspecified: Secondary | ICD-10-CM | POA: Diagnosis not present

## 2017-02-25 DIAGNOSIS — R300 Dysuria: Secondary | ICD-10-CM

## 2017-02-25 DIAGNOSIS — Z79899 Other long term (current) drug therapy: Secondary | ICD-10-CM | POA: Insufficient documentation

## 2017-02-25 DIAGNOSIS — R3915 Urgency of urination: Secondary | ICD-10-CM | POA: Diagnosis present

## 2017-02-25 DIAGNOSIS — E785 Hyperlipidemia, unspecified: Secondary | ICD-10-CM | POA: Diagnosis not present

## 2017-02-25 DIAGNOSIS — K219 Gastro-esophageal reflux disease without esophagitis: Secondary | ICD-10-CM | POA: Diagnosis not present

## 2017-02-25 DIAGNOSIS — R350 Frequency of micturition: Secondary | ICD-10-CM | POA: Diagnosis present

## 2017-02-25 DIAGNOSIS — F419 Anxiety disorder, unspecified: Secondary | ICD-10-CM | POA: Diagnosis not present

## 2017-02-25 LAB — POCT URINALYSIS DIP (DEVICE)
BILIRUBIN URINE: NEGATIVE
Glucose, UA: NEGATIVE mg/dL
HGB URINE DIPSTICK: NEGATIVE
Ketones, ur: NEGATIVE mg/dL
LEUKOCYTES UA: NEGATIVE
NITRITE: NEGATIVE
Protein, ur: NEGATIVE mg/dL
Specific Gravity, Urine: 1.015 (ref 1.005–1.030)
Urobilinogen, UA: 0.2 mg/dL (ref 0.0–1.0)
pH: 5.5 (ref 5.0–8.0)

## 2017-02-25 MED ORDER — CEPHALEXIN 500 MG PO CAPS: 500.0000 mg | ORAL_CAPSULE | Freq: Four times a day (QID) | ORAL | 0 refills | Status: DC

## 2017-02-25 NOTE — ED Provider Notes (Signed)
[facility]    CSN: [id number] Arrival date & time: 02/25/17  1514     History   Chief Complaint Chief Complaint  Patient presents with  . Urinary Tract Infection    HPI Kelli Evans is a 51 y.o. female.   As per nursing note. 51 year old female complaining of dysuria described as a burning with urination, urinary urgency and frequency. She also has pain in the left inguinal and in the left lower most abdomen. It is worse when she sits. Sometimes she will sit on the commode and feel as though she needs to urinate but "nothing comes out". Denies nausea, vomiting or flank pain. It is noted that her temperatures 100 today.      Past Medical History:  Diagnosis Date  . GERD (gastroesophageal reflux disease)   . Hyperlipidemia   . Hypothyroidism     Patient Active Problem List   Diagnosis Date Noted  . Rash 12/19/2016  . Plantar fasciitis, left 03/28/2014  . Acne 11/01/2012  . Obesity (BMI 30-39.9) 10/31/2012  . Depression with anxiety 10/29/2011  . Conjunctivitis of right eye 08/18/2011  . Environmental allergies 07/10/2010  . HYPOTHYROIDISM 05/28/2008  . HYPERLIPIDEMIA 05/28/2008  . CARPAL TUNNEL SYNDROME, BILATERAL 05/28/2008  . GERD 05/28/2008    No past surgical history on file.  OB History    No data available       Home Medications    Prior to Admission medications   Medication Sig Start Date End Date Taking? Authorizing Provider  ALLERGY 10 MG tablet TAKE 1 TABLET BY MOUTH DAILY 12/21/16  Yes Carollee Herter, Kendrick Fries R, DO  omeprazole (PRILOSEC) 20 MG capsule TAKE ONE CAPSULE BY MOUTH DAILY 01/08/17  Yes Ann Held, DO  spironolactone (ALDACTONE) 25 MG tablet Take 25 mg by mouth 2 (two) times daily.  05/27/16  Yes [provider]  venlafaxine XR (EFFEXOR-XR) 150 MG 24 hr capsule TAKE ONE CAPSULE BY MOUTH DAILY 01/08/17  Yes Carollee Herter, Yvonne R, DO  ALPRAZolam (XANAX) 0.25 MG tablet TAKE 1 TABLET BY MOUTH 3 TIMES A DAY AS  NEEDED 06/11/16   Carollee Herter, Alferd Apa, DO  cephALEXin (KEFLEX) 500 MG capsule Take 1 capsule (500 mg total) by mouth 4 (four) times daily. 02/25/17   Janne Napoleon, NP  fluticasone (FLONASE) 50 MCG/ACT nasal spray Place 2 sprays into both nostrils daily. 08/07/13   Roma Schanz R, DO  predniSONE (DELTASONE) 10 MG tablet TAKE 3 TABLETS PO QD FOR 3 DAYS THEN TAKE 2 TABLETS PO QD FOR 3 DAYS THEN TAKE 1 TABLET PO QD FOR 3 DAYS THEN TAKE 1/2 TAB PO QD FOR 3 DAYS 12/19/16   Ann Held, DO    Family History Family History  Problem Relation Age of Onset  . Breast cancer Mother   . Cancer Mother 9       breast  . Hypertension Father   . Heart disease Father        chf, pacemaker  . Cancer Father        brain  . Hyperlipidemia Father   . Hypothyroidism Father   . Cancer Cousin 47       breast   . Breast cancer Maternal Grandfather   . Cancer Daughter        Pre cancerous placed on tamoxifene    Social History Social History   Tobacco Use  . Smoking status: Never Smoker  . Smokeless tobacco: Never Used  Substance Use  Topics  . Alcohol use: Yes  . Drug use: No     Allergies   Patient has no known allergies.   Review of Systems Review of Systems  HENT: Negative.   Genitourinary: Positive for dysuria, frequency and urgency. Negative for vaginal bleeding and vaginal discharge.  Neurological: Negative.   All other systems reviewed and are negative.    Physical Exam Triage Vital Signs ED Triage Vitals  Enc Vitals Group     BP 02/25/17 1605 (!) 121/58     Pulse Rate 02/25/17 1605 89     Resp --      Temp 02/25/17 1605 100 F (37.8 C)     Temp Source 02/25/17 1605 Oral     SpO2 02/25/17 1605 100 %     Weight --      Height --      Head Circumference --      Peak Flow --      Pain Score 02/25/17 1604 5     Pain Loc --      Pain Edu? --      Excl. in [place]? --    No data found.  Updated Vital Signs BP (!) 121/58 (BP Location: Left Arm)   Pulse 89    Temp 100 F (37.8 C) (Oral)   LMP 01/24/2017 (Exact Date)   SpO2 100%   Visual Acuity Right Eye Distance:   Left Eye Distance:   Bilateral Distance:    Right Eye Near:   Left Eye Near:    Bilateral Near:     Physical Exam  Constitutional: She is oriented to person, place, and time. She appears well-developed and well-nourished. No distress.  Neck: Neck supple.  Cardiovascular: Normal rate.  Pulmonary/Chest: Effort normal. No respiratory distress.  Abdominal: Soft. Bowel sounds are normal. She exhibits no distension.  Palpating of the left lower quadrant when placing the hand over the anterior pelvis and pushing superiorly produces pain. There is also tenderness over the mid suprapubic area. No rebound or guarding. No upper abdominal tenderness or right lower quadrant tenderness.  No pain in the left inguina with straight leg raise. No tenderness to the musculoskeletal structures.  Musculoskeletal: Normal range of motion. She exhibits no edema.  Neurological: She is alert and oriented to person, place, and time. No cranial nerve deficit. She exhibits normal muscle tone.  Skin: Skin is warm and dry.  Psychiatric: She has a normal mood and affect.  Nursing note and vitals reviewed.    UC Treatments / Results  Labs (all labs ordered are listed, but only abnormal results are displayed) Labs Reviewed  URINE CULTURE  POCT URINALYSIS DIP (DEVICE)    EKG  EKG Interpretation None       Radiology No results found.  Procedures Procedures (including critical care time)  Medications Ordered in UC Medications - No data to display   Initial Impression / Assessment and Plan / UC Course  I have reviewed the triage vital signs and the nursing notes.  Pertinent labs & imaging results that were available during my care of the patient were reviewed by me and considered in my medical decision making (see chart for details). Take the antibiotic as directed. Also use AZO standard  1-2 tablets 3 times a day for the next couple days for symptoms. Drink plenty of water    Consider interstitial cystitis. To follow-up with PCP.   Final Clinical Impressions(s) / UC Diagnoses   Final diagnoses:  Dysuria  Acute cystitis  without hematuria    ED Discharge Orders        Ordered    cephALEXin (KEFLEX) 500 MG capsule  4 times daily     02/25/17 1721       Controlled Substance Prescriptions [place] Controlled Substance Registry consulted? Not Applicable   Janne Napoleon, NP 02/25/17 1724

## 2017-02-25 NOTE — ED Triage Notes (Signed)
Urinating a lot, lower abdominal pain on the left side, burning when urinate, started yesterday,

## 2017-02-25 NOTE — Discharge Instructions (Signed)
Take the antibiotic as directed. Also use AZO standard 1-2 tablets 3 times a day for the next couple days for symptoms. Drink plenty of water

## 2017-02-26 ENCOUNTER — Ambulatory Visit: Payer: BC Managed Care – PPO | Admitting: Family Medicine

## 2017-02-26 LAB — URINE CULTURE: CULTURE: NO GROWTH

## 2017-03-25 ENCOUNTER — Ambulatory Visit (INDEPENDENT_AMBULATORY_CARE_PROVIDER_SITE_OTHER): Payer: BC Managed Care – PPO | Admitting: Family Medicine

## 2017-03-25 ENCOUNTER — Encounter: Payer: Self-pay | Admitting: Family Medicine

## 2017-03-25 VITALS — BP 122/86 | HR 78 | Temp 98.4°F | Resp 16 | Ht 67.0 in | Wt 216.8 lb

## 2017-03-25 DIAGNOSIS — K219 Gastro-esophageal reflux disease without esophagitis: Secondary | ICD-10-CM

## 2017-03-25 DIAGNOSIS — Z23 Encounter for immunization: Secondary | ICD-10-CM

## 2017-03-25 DIAGNOSIS — F411 Generalized anxiety disorder: Secondary | ICD-10-CM | POA: Diagnosis not present

## 2017-03-25 DIAGNOSIS — Z79899 Other long term (current) drug therapy: Secondary | ICD-10-CM

## 2017-03-25 DIAGNOSIS — Z0000 Encounter for general adult medical examination without abnormal findings: Secondary | ICD-10-CM

## 2017-03-25 LAB — COMPREHENSIVE METABOLIC PANEL
ALT: 31 U/L (ref 0–35)
AST: 21 U/L (ref 0–37)
Albumin: 4.6 g/dL (ref 3.5–5.2)
Alkaline Phosphatase: 63 U/L (ref 39–117)
BUN: 11 mg/dL (ref 6–23)
CALCIUM: 10 mg/dL (ref 8.4–10.5)
CHLORIDE: 99 meq/L (ref 96–112)
CO2: 26 meq/L (ref 19–32)
Creatinine, Ser: 0.74 mg/dL (ref 0.40–1.20)
GFR: 87.9 mL/min (ref >60.00)
GLUCOSE: 143 mg/dL — AB (ref 70–99)
Potassium: 4.4 mEq/L (ref 3.5–5.1)
Sodium: 136 mEq/L (ref 135–145)
Total Bilirubin: 0.7 mg/dL (ref 0.2–1.2)
Total Protein: 7.7 g/dL (ref 6.0–8.3)

## 2017-03-25 LAB — CBC WITH DIFFERENTIAL/PLATELET
BASOS PCT: 0.9 % (ref 0.0–3.0)
Basophils Absolute: 0.1 10*3/uL (ref 0.0–0.1)
EOS PCT: 1.9 % (ref 0.0–5.0)
Eosinophils Absolute: 0.1 10*3/uL (ref 0.0–0.7)
HEMATOCRIT: 43.3 % (ref 36.0–46.0)
Hemoglobin: 14.4 g/dL (ref 12.0–15.0)
LYMPHS ABS: 1.1 10*3/uL (ref 0.7–4.0)
LYMPHS PCT: 15.1 % (ref 12.0–46.0)
MCHC: 33.2 g/dL (ref 30.0–36.0)
MCV: 83.3 fl (ref 78.0–100.0)
MONOS PCT: 5.3 % (ref 3.0–12.0)
Monocytes Absolute: 0.4 10*3/uL (ref 0.1–1.0)
NEUTROS ABS: 5.6 10*3/uL (ref 1.4–7.7)
NEUTROS PCT: 76.8 % (ref 43.0–77.0)
PLATELETS: 283 10*3/uL (ref 150.0–400.0)
RBC: 5.19 Mil/uL — ABNORMAL HIGH (ref 3.87–5.11)
RDW: 14.5 % (ref 11.5–15.5)
WBC: 7.3 10*3/uL (ref 4.0–10.5)

## 2017-03-25 LAB — LIPID PANEL
CHOL/HDL RATIO: 5
Cholesterol: 242 mg/dL — ABNORMAL HIGH (ref 0–200)
HDL: 44.7 mg/dL (ref >39.00)
LDL Cholesterol: 171 mg/dL — ABNORMAL HIGH (ref 0–99)
NONHDL: 197.06
Triglycerides: 128 mg/dL (ref 0.0–149.0)
VLDL: 25.6 mg/dL (ref 0.0–40.0)

## 2017-03-25 LAB — POC URINALSYSI DIPSTICK (AUTOMATED)
BILIRUBIN UA: NEGATIVE
Glucose, UA: NEGATIVE
KETONES UA: NEGATIVE
LEUKOCYTES UA: NEGATIVE
NITRITE UA: NEGATIVE
PH UA: 6 (ref 5.0–8.0)
RBC UA: NEGATIVE
Spec Grav, UA: >=1.03 — AB (ref 1.010–1.025)
Urobilinogen, UA: NEGATIVE E.U./dL — AB

## 2017-03-25 LAB — TSH: TSH: 3.87 u[IU]/mL (ref 0.35–4.50)

## 2017-03-25 MED ORDER — ALPRAZOLAM 0.25 MG PO TABS: 0.2500 mg | ORAL_TABLET | Freq: Three times a day (TID) | ORAL | 1 refills | Status: DC | PRN

## 2017-03-25 MED ORDER — VENLAFAXINE HCL ER 150 MG PO CP24: 150.0000 mg | ORAL_CAPSULE | Freq: Every day | ORAL | 3 refills | Status: DC

## 2017-03-25 MED ORDER — OMEPRAZOLE 20 MG PO CPDR: 20.0000 mg | DELAYED_RELEASE_CAPSULE | Freq: Every day | ORAL | 3 refills | Status: DC

## 2017-03-25 NOTE — Patient Instructions (Signed)
Preventive Care 40-64 Years, Female Preventive care refers to lifestyle choices and visits with your health care provider that can promote health and wellness. What does preventive care include?  A yearly physical exam. This is also called an annual well check.  Dental exams once or twice a year.  Routine eye exams. Ask your health care provider how often you should have your eyes checked.  Personal lifestyle choices, including: ? Daily care of your teeth and gums. ? Regular physical activity. ? Eating a healthy diet. ? Avoiding tobacco and drug use. ? Limiting alcohol use. ? Practicing safe sex. ? Taking low-dose aspirin daily starting at age 58. ? Taking vitamin and mineral supplements as recommended by your health care provider. What happens during an annual well check? The services and screenings done by your health care provider during your annual well check will depend on your age, overall health, lifestyle risk factors, and family history of disease. Counseling Your health care provider may ask you questions about your:  Alcohol use.  Tobacco use.  Drug use.  Emotional well-being.  Home and relationship well-being.  Sexual activity.  Eating habits.  Work and work Statistician.  Method of birth control.  Menstrual cycle.  Pregnancy history.  Screening You may have the following tests or measurements:  Height, weight, and BMI.  Blood pressure.  Lipid and cholesterol levels. These may be checked every 5 years, or more frequently if you are over 81 years old.  Skin check.  Lung cancer screening. You may have this screening every year starting at age 78 if you have a 30-pack-year history of smoking and currently smoke or have quit within the past 15 years.  Fecal occult blood test (FOBT) of the stool. You may have this test every year starting at age 65.  Flexible sigmoidoscopy or colonoscopy. You may have a sigmoidoscopy every 5 years or a colonoscopy  every 10 years starting at age 30.  Hepatitis C blood test.  Hepatitis B blood test.  Sexually transmitted disease (STD) testing.  Diabetes screening. This is done by checking your blood sugar (glucose) after you have not eaten for a while (fasting). You may have this done every 1-3 years.  Mammogram. This may be done every 1-2 years. Talk to your health care provider about when you should start having regular mammograms. This may depend on whether you have a family history of breast cancer.  BRCA-related cancer screening. This may be done if you have a family history of breast, ovarian, tubal, or peritoneal cancers.  Pelvic exam and Pap test. This may be done every 3 years starting at age 80. Starting at age 36, this may be done every 5 years if you have a Pap test in combination with an HPV test.  Bone density scan. This is done to screen for osteoporosis. You may have this scan if you are at high risk for osteoporosis.  Discuss your test results, treatment options, and if necessary, the need for more tests with your health care provider. Vaccines Your health care provider may recommend certain vaccines, such as:  Influenza vaccine. This is recommended every year.  Tetanus, diphtheria, and acellular pertussis (Tdap, Td) vaccine. You may need a Td booster every 10 years.  Varicella vaccine. You may need this if you have not been vaccinated.  Zoster vaccine. You may need this after age 5.  Measles, mumps, and rubella (MMR) vaccine. You may need at least one dose of MMR if you were born in  1957 or later. You may also need a second dose.  Pneumococcal 13-valent conjugate (PCV13) vaccine. You may need this if you have certain conditions and were not previously vaccinated.  Pneumococcal polysaccharide (PPSV23) vaccine. You may need one or two doses if you smoke cigarettes or if you have certain conditions.  Meningococcal vaccine. You may need this if you have certain  conditions.  Hepatitis A vaccine. You may need this if you have certain conditions or if you travel or work in places where you may be exposed to hepatitis A.  Hepatitis B vaccine. You may need this if you have certain conditions or if you travel or work in places where you may be exposed to hepatitis B.  Haemophilus influenzae type b (Hib) vaccine. You may need this if you have certain conditions.  Talk to your health care provider about which screenings and vaccines you need and how often you need them. This information is not intended to replace advice given to you by your health care provider. Make sure you discuss any questions you have with your health care provider. Document Released: 04/08/2015 Document Revised: 11/30/2015 Document Reviewed: 01/11/2015 Elsevier Interactive Patient Education  2018 Elsevier Inc.  

## 2017-03-25 NOTE — Progress Notes (Signed)
Subjective:     Kelli Evans is a 51 y.o. female and is here for a comprehensive physical exam. The patient reports no problems.  Social History   Socioeconomic History  . Marital status: Single    Spouse name: Not on file  . Number of children: Not on file  . Years of education: Not on file  . Highest education level: Not on file  Social Needs  . Financial resource strain: Not on file  . Food insecurity - worry: Not on file  . Food insecurity - inability: Not on file  . Transportation needs - medical: Not on file  . Transportation needs - non-medical: Not on file  Occupational History  . 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 Smoking status: Never Smoker  . Smokeless tobacco: Never Used  Substance and Sexual Activity  . Alcohol use: Yes  . Drug use: No  . Sexual activity: No  Other Topics Concern  . Not on file  Social History Narrative   Exercise-- no   Health Maintenance  Topic Date Due  . COLONOSCOPY  02/12/2016  . MAMMOGRAM  05/03/2016  . HIV Screening  03/25/2018 (Originally 02/11/1981)  . PAP SMEAR  01/30/2019  . TETANUS/TDAP  10/04/2019  . INFLUENZA VACCINE  Completed    The following portions of the patient's history were reviewed and updated as appropriate:  She  has a past medical history of GERD (gastroesophageal reflux disease), Hyperlipidemia, and Hypothyroidism. She does not have any pertinent problems on file. She  has no past surgical history on file. Her family history includes Breast cancer in her maternal grandfather and mother; Cancer in her daughter and father; Cancer (age of onset: 79) in her cousin; Cancer (age of onset: 6) in her mother; Heart disease in her father; Hyperlipidemia in her father; Hypertension in her father; Hypothyroidism in her father. She  reports that  has never smoked. she has never used smokeless tobacco. She reports that she drinks alcohol.  She reports that she does not use drugs. She has a current medication list which includes the following prescription(s): allergy, alprazolam, omeprazole, spironolactone, and venlafaxine xr, and the following Facility-Administered Medications: betamethasone acetate-betamethasone sodium phosphate and betamethasone acetate-betamethasone sodium phosphate. Current Outpatient Medications on File Prior to Visit  Medication Sig Dispense Refill  . ALLERGY 10 MG tablet TAKE 1 TABLET BY MOUTH DAILY 30 tablet 3  . spironolactone (ALDACTONE) 25 MG tablet Take 25 mg by mouth 2 (two) times daily.   3   Current Facility-Administered Medications on File Prior to Visit  Medication Dose Route Frequency Provider Last Rate Last Dose  . betamethasone acetate-betamethasone sodium phosphate (CELESTONE) injection 3 mg  3 mg Intramuscular Once Daylene Katayama M, DPM      . betamethasone acetate-betamethasone sodium phosphate (CELESTONE) injection 3 mg  3 mg Intramuscular Once Edrick Kins, DPM       She has No Known Allergies..  Review of Systems Review of Systems  Constitutional: Negative for activity change, appetite change and fatigue.  HENT: Negative for hearing loss, congestion, tinnitus and ear discharge.  dentist q42m Eyes: Negative for visual disturbance (see optho q1y -- vision corrected to 20/20 with glasses).  Respiratory: Negative for cough, chest tightness and shortness of breath.   Cardiovascular: Negative for chest pain, palpitations and leg swelling.  Gastrointestinal: Negative for abdominal pain, diarrhea, constipation and abdominal distention.  Genitourinary: Negative for urgency,  frequency, decreased urine volume and difficulty urinating.  Musculoskeletal: Negative for back pain, arthralgias and gait problem.  Skin: Negative for color change, pallor and rash.  Neurological: Negative for dizziness, light-headedness, numbness and headaches.  Hematological: Negative for adenopathy. Does not  bruise/bleed easily.  Psychiatric/Behavioral: Negative for suicidal ideas, confusion, sleep disturbance, self-injury, dysphoric mood, decreased concentration and agitation.       Objective:    BP 122/86 (BP Location: Right Arm, Cuff Size: Large)   Pulse 92   Temp 98.4 F (36.9 C) (Oral)   Resp 16   Ht 5\' 7"  (1.702 m)   Wt 216 lb 12.8 oz (98.3 kg)   LMP 03/16/2017   SpO2 98%   BMI 33.96 kg/m  General appearance: alert, cooperative, appears stated age and no distress Head: Normocephalic, without obvious abnormality, atraumatic Eyes: negative findings: lids and lashes normal and pupils equal, round, reactive to light and accomodation Ears: normal TM's and external ear canals both ears Nose: Nares normal. Septum midline. Mucosa normal. No drainage or sinus tenderness. Throat: lips, mucosa, and tongue normal; teeth and gums normal Neck: no adenopathy, no carotid bruit, no JVD, supple, symmetrical, trachea midline and thyroid not enlarged, symmetric, no tenderness/mass/nodules Back: symmetric, no curvature. ROM normal. No CVA tenderness. Lungs: clear to auscultation bilaterally Breasts: normal appearance, no masses or tenderness Heart: regular rate and rhythm, S1, S2 normal, no murmur, click, rub or gallop Abdomen: soft, non-tender; bowel sounds normal; no masses,  no organomegaly Pelvic: deferred Extremities: extremities normal, atraumatic, no cyanosis or edema Pulses: 2+ and symmetric Skin: Skin color, texture, turgor normal. No rashes or lesions Lymph nodes: Cervical, supraclavicular, and axillary nodes normal. Neurologic: Alert and oriented X 3, normal strength and tone. Normal symmetric reflexes. Normal coordination and gait    Assessment:    Healthy female exam.      Plan:    ghm utd Check lab bs See After Visit Summary for Counseling Recommendations   Pt receceived flu and shingles vaccine today rto 2 months for shingrix #2  1. Preventative health care See  above - Ambulatory referral to Gastroenterology - POCT Urinalysis Dipstick (Automated) - Comprehensive metabolic panel - CBC with Differential/Platelet - Lipid panel - TSH  2. Generalized anxiety disorder stable - ALPRAZolam (XANAX) 0.25 MG tablet; Take 1 tablet (0.25 mg total) by mouth 3 (three) times daily as needed.  Dispense: 30 tablet; Refill: 1  3. Gastroesophageal reflux disease, esophagitis presence not specified Stable, refill meds - omeprazole (PRILOSEC) 20 MG capsule; Take 1 capsule (20 mg total) by mouth daily.  Dispense: 90 capsule; Refill: 3

## 2017-03-27 NOTE — Addendum Note (Signed)
Addended by: Kem Boroughs D on: 03/27/2017 08:57 AM   Modules accepted: Orders

## 2017-03-28 LAB — PAIN MGMT, PROFILE 8 W/CONF, U
6 Acetylmorphine: NEGATIVE ng/mL (ref <10)
Alcohol Metabolites: NEGATIVE ng/mL (ref <500)
Amphetamines: NEGATIVE ng/mL (ref <500)
BENZODIAZEPINES: NEGATIVE ng/mL (ref <100)
BUPRENORPHINE, URINE: NEGATIVE ng/mL (ref <5)
COCAINE METABOLITE: NEGATIVE ng/mL (ref <150)
CREATININE: 238.9 mg/dL
MARIJUANA METABOLITE: NEGATIVE ng/mL (ref <20)
MDMA: NEGATIVE ng/mL (ref <500)
Opiates: NEGATIVE ng/mL (ref <100)
Oxidant: NEGATIVE ug/mL (ref <200)
Oxycodone: NEGATIVE ng/mL (ref <100)
PH: 6.21 (ref 4.5–9.0)

## 2017-04-05 ENCOUNTER — Encounter: Payer: Self-pay | Admitting: Family Medicine

## 2017-05-24 ENCOUNTER — Ambulatory Visit (INDEPENDENT_AMBULATORY_CARE_PROVIDER_SITE_OTHER): Payer: BC Managed Care – PPO

## 2017-05-24 DIAGNOSIS — Z23 Encounter for immunization: Secondary | ICD-10-CM

## 2017-05-24 NOTE — Progress Notes (Signed)
Pre visit review using our clinic review tool, if applicable. No additional management support is needed unless otherwise documented below in the visit note.  Pt here today for Shingrix #2. 0.54mL injected into L deltoid. Pt tolerated injected well. Pt reminded to look for rash, pain, redness, swelling. Pt verbalized understanding.

## 2017-07-12 ENCOUNTER — Other Ambulatory Visit: Payer: Self-pay | Admitting: Family Medicine

## 2017-07-12 DIAGNOSIS — F411 Generalized anxiety disorder: Secondary | ICD-10-CM

## 2017-07-15 NOTE — Telephone Encounter (Signed)
Requesting: XANAX 0.25 MG Contract: 03/25/17 UDS: 03/25/17 Low Risk Last OV: 03/25/17 Next OV:  - Last Refill: 03/25/17  #30   Please advise

## 2017-11-13 ENCOUNTER — Other Ambulatory Visit: Payer: Self-pay | Admitting: Family Medicine

## 2017-11-13 DIAGNOSIS — F411 Generalized anxiety disorder: Secondary | ICD-10-CM

## 2017-11-15 ENCOUNTER — Other Ambulatory Visit: Payer: Self-pay

## 2017-11-15 DIAGNOSIS — F411 Generalized anxiety disorder: Secondary | ICD-10-CM

## 2017-11-15 MED ORDER — ALPRAZOLAM 0.25 MG PO TABS: 0.2500 mg | ORAL_TABLET | Freq: Three times a day (TID) | ORAL | 1 refills | Status: DC | PRN

## 2017-11-15 NOTE — Telephone Encounter (Signed)
Received refill request for  ALPRAZOLAM ( XANAX) 0.25 mg tablet.   Last OV: 03/25/17 Last RF: 07/15/17  03/25/17:CSC completed and signed UDS sample given. Low risk. Repeat UDS 02/2018  Controlled substance data base report printed.

## 2017-11-15 NOTE — Telephone Encounter (Signed)
Request sent to provider for approval.  

## 2018-04-11 ENCOUNTER — Other Ambulatory Visit: Payer: Self-pay | Admitting: Family Medicine

## 2018-05-25 ENCOUNTER — Other Ambulatory Visit: Payer: Self-pay | Admitting: Family Medicine

## 2018-05-25 DIAGNOSIS — K219 Gastro-esophageal reflux disease without esophagitis: Secondary | ICD-10-CM

## 2018-05-26 ENCOUNTER — Other Ambulatory Visit: Payer: Self-pay | Admitting: *Deleted

## 2018-05-26 ENCOUNTER — Encounter: Payer: Self-pay | Admitting: Family Medicine

## 2018-05-26 MED ORDER — VENLAFAXINE HCL ER 150 MG PO CP24: 150.0000 mg | ORAL_CAPSULE | Freq: Every day | ORAL | 0 refills | Status: DC

## 2018-05-26 NOTE — Telephone Encounter (Signed)
Patient has an appointment set up in April.

## 2018-05-26 NOTE — Telephone Encounter (Signed)
Pt last appt with PCP 03/25/17. She has scheduled an appt with PCP on 07/18/18. Last effexor RX said pt needed an appointment for further refills. Please advise Effexor request?

## 2018-06-20 ENCOUNTER — Other Ambulatory Visit: Payer: Self-pay | Admitting: Family Medicine

## 2018-06-23 ENCOUNTER — Other Ambulatory Visit: Payer: Self-pay | Admitting: Family Medicine

## 2018-06-23 ENCOUNTER — Encounter: Payer: Self-pay | Admitting: Family Medicine

## 2018-06-23 MED ORDER — VENLAFAXINE HCL ER 150 MG PO CP24: 150.0000 mg | ORAL_CAPSULE | Freq: Every day | ORAL | 1 refills | Status: DC

## 2018-07-18 ENCOUNTER — Ambulatory Visit (INDEPENDENT_AMBULATORY_CARE_PROVIDER_SITE_OTHER): Payer: BC Managed Care – PPO | Admitting: Family Medicine

## 2018-07-18 ENCOUNTER — Other Ambulatory Visit: Payer: Self-pay

## 2018-07-18 ENCOUNTER — Encounter: Payer: Self-pay | Admitting: Family Medicine

## 2018-07-18 DIAGNOSIS — F411 Generalized anxiety disorder: Secondary | ICD-10-CM | POA: Diagnosis not present

## 2018-07-18 DIAGNOSIS — E785 Hyperlipidemia, unspecified: Secondary | ICD-10-CM

## 2018-07-18 DIAGNOSIS — E039 Hypothyroidism, unspecified: Secondary | ICD-10-CM | POA: Diagnosis not present

## 2018-07-18 DIAGNOSIS — K219 Gastro-esophageal reflux disease without esophagitis: Secondary | ICD-10-CM

## 2018-07-18 DIAGNOSIS — Z0000 Encounter for general adult medical examination without abnormal findings: Secondary | ICD-10-CM

## 2018-07-18 DIAGNOSIS — L700 Acne vulgaris: Secondary | ICD-10-CM

## 2018-07-18 MED ORDER — OMEPRAZOLE 20 MG PO CPDR: DELAYED_RELEASE_CAPSULE | ORAL | 3 refills | Status: DC

## 2018-07-18 MED ORDER — ALPRAZOLAM 0.25 MG PO TABS: 0.2500 mg | ORAL_TABLET | Freq: Three times a day (TID) | ORAL | 1 refills | Status: DC | PRN

## 2018-07-18 MED ORDER — VENLAFAXINE HCL ER 150 MG PO CP24: 150.0000 mg | ORAL_CAPSULE | Freq: Every day | ORAL | 3 refills | Status: DC

## 2018-07-18 MED ORDER — SPIRONOLACTONE 25 MG PO TABS: 25.0000 mg | ORAL_TABLET | Freq: Two times a day (BID) | ORAL | 3 refills | Status: DC

## 2018-07-18 NOTE — Progress Notes (Signed)
Virtual Visit via Video Note  I connected with Kelli Evans on 07/18/18 at  3:30 PM EDT by a video enabled telemedicine application and verified that I am speaking with the correct person using two identifiers.   I discussed the limitations of evaluation and management by telemedicine and the availability of in person appointments. The patient expressed understanding and agreed to proceed.  History of Present Illness:   pt is home -- needs refills and to schedule labs    We will schedule gyn exam later in the year    Pt has not complaints    Past Medical History:  Diagnosis Date  . GERD (gastroesophageal reflux disease)   . Hyperlipidemia   . Hypothyroidism    Outpatient Encounter Medications as of 07/18/2018  Medication Sig  . ALLERGY 10 MG tablet TAKE 1 TABLET BY MOUTH DAILY  . ALPRAZolam (XANAX) 0.25 MG tablet Take 1 tablet (0.25 mg total) by mouth 3 (three) times daily as needed.  Marland Kitchen omeprazole (PRILOSEC) 20 MG capsule TAKE 1 CAPSULE BY MOUTH EVERY DAY  . spironolactone (ALDACTONE) 25 MG tablet Take 1 tablet (25 mg total) by mouth 2 (two) times daily.  Marland Kitchen venlafaxine XR (EFFEXOR-XR) 150 MG 24 hr capsule Take 1 capsule (150 mg total) by mouth daily.  . [DISCONTINUED] ALPRAZolam (XANAX) 0.25 MG tablet Take 1 tablet (0.25 mg total) by mouth 3 (three) times daily as needed.  . [DISCONTINUED] omeprazole (PRILOSEC) 20 MG capsule TAKE 1 CAPSULE BY MOUTH EVERY DAY  . [DISCONTINUED] spironolactone (ALDACTONE) 25 MG tablet Take 25 mg by mouth 2 (two) times daily.   . [DISCONTINUED] venlafaxine XR (EFFEXOR-XR) 150 MG 24 hr capsule Take 1 capsule (150 mg total) by mouth daily.   Facility-Administered Encounter Medications as of 07/18/2018  Medication  . betamethasone acetate-betamethasone sodium phosphate (CELESTONE) injection 3 mg  . betamethasone acetate-betamethasone sodium phosphate (CELESTONE) injection 3 mg    Provider -- working from home Observations/Objective: No vitals due to web  visit Pt is in NAD No sob No cp No fevers    Assessment and Plan: 1. Hyperlipidemia, unspecified hyperlipidemia type Encouraged heart healthy diet, increase exercise, avoid trans fats, consider a krill oil cap daily - Lipid panel; Future - TSH; Future - Comprehensive metabolic panel; Future  2. Hypothyroidism, unspecified type Check labs  - TSH; Future  3. Gastroesophageal reflux disease, esophagitis presence not specified Stable,  Refill smeds  - omeprazole (PRILOSEC) 20 MG capsule; TAKE 1 CAPSULE BY MOUTH EVERY DAY  Dispense: 90 capsule; Refill: 3  4. Generalized anxiety disorder Stable-- pt doing well ,  meds refilled  - venlafaxine XR (EFFEXOR-XR) 150 MG 24 hr capsule; Take 1 capsule (150 mg total) by mouth daily.  Dispense: 90 capsule; Refill: 3 - ALPRAZolam (XANAX) 0.25 MG tablet; Take 1 tablet (0.25 mg total) by mouth 3 (three) times daily as needed.  Dispense: 30 tablet; Refill: 1  5. Acne vulgaris Controlled with med  - spironolactone (ALDACTONE) 25 MG tablet; Take 1 tablet (25 mg total) by mouth 2 (two) times daily.  Dispense: 180 tablet; Refill: 3  6. Preventative health care Will reschedule later in the year with pap  - Ambulatory referral to Gastroenterology   Follow Up Instructions:    I discussed the assessment and treatment plan with the patient. The patient was provided an opportunity to ask questions and all were answered. The patient agreed with the plan and demonstrated an understanding of the instructions.   The patient was advised to  call back or seek an in-person evaluation if the symptoms worsen or if the condition fails to improve as anticipated.  I provided 25 minutes of non-face-to-face time during this encounter.   Ann Held, DO

## 2018-07-20 ENCOUNTER — Other Ambulatory Visit: Payer: Self-pay | Admitting: Family Medicine

## 2018-07-20 DIAGNOSIS — K219 Gastro-esophageal reflux disease without esophagitis: Secondary | ICD-10-CM

## 2018-07-22 ENCOUNTER — Other Ambulatory Visit: Payer: Self-pay

## 2018-07-22 ENCOUNTER — Other Ambulatory Visit (INDEPENDENT_AMBULATORY_CARE_PROVIDER_SITE_OTHER): Payer: BC Managed Care – PPO

## 2018-07-22 DIAGNOSIS — E785 Hyperlipidemia, unspecified: Secondary | ICD-10-CM | POA: Diagnosis not present

## 2018-07-22 DIAGNOSIS — E039 Hypothyroidism, unspecified: Secondary | ICD-10-CM

## 2018-07-22 LAB — COMPREHENSIVE METABOLIC PANEL
ALT: 20 U/L (ref 0–35)
AST: 14 U/L (ref 0–37)
Albumin: 4.3 g/dL (ref 3.5–5.2)
Alkaline Phosphatase: 67 U/L (ref 39–117)
BUN: 9 mg/dL (ref 6–23)
CO2: 28 mEq/L (ref 19–32)
Calcium: 9 mg/dL (ref 8.4–10.5)
Chloride: 100 mEq/L (ref 96–112)
Creatinine, Ser: 0.69 mg/dL (ref 0.40–1.20)
GFR: 89.19 mL/min (ref >60.00)
Glucose, Bld: 154 mg/dL — ABNORMAL HIGH (ref 70–99)
Potassium: 4.2 mEq/L (ref 3.5–5.1)
Sodium: 136 mEq/L (ref 135–145)
Total Bilirubin: 0.5 mg/dL (ref 0.2–1.2)
Total Protein: 6.9 g/dL (ref 6.0–8.3)

## 2018-07-22 LAB — LIPID PANEL
Cholesterol: 235 mg/dL — ABNORMAL HIGH (ref 0–200)
HDL: 51.4 mg/dL (ref >39.00)
LDL Cholesterol: 159 mg/dL — ABNORMAL HIGH (ref 0–99)
NonHDL: 183.7
Total CHOL/HDL Ratio: 5
Triglycerides: 122 mg/dL (ref 0.0–149.0)
VLDL: 24.4 mg/dL (ref 0.0–40.0)

## 2018-07-22 LAB — TSH: TSH: 5.48 u[IU]/mL — ABNORMAL HIGH (ref 0.35–4.50)

## 2018-07-24 ENCOUNTER — Other Ambulatory Visit: Payer: Self-pay | Admitting: Family Medicine

## 2018-07-24 DIAGNOSIS — R739 Hyperglycemia, unspecified: Secondary | ICD-10-CM

## 2018-07-24 DIAGNOSIS — E785 Hyperlipidemia, unspecified: Secondary | ICD-10-CM

## 2018-09-07 ENCOUNTER — Other Ambulatory Visit: Payer: Self-pay | Admitting: Family Medicine

## 2018-09-07 DIAGNOSIS — F411 Generalized anxiety disorder: Secondary | ICD-10-CM

## 2018-09-08 ENCOUNTER — Other Ambulatory Visit: Payer: Self-pay

## 2018-09-08 DIAGNOSIS — Z1231 Encounter for screening mammogram for malignant neoplasm of breast: Secondary | ICD-10-CM

## 2018-09-08 DIAGNOSIS — Z1211 Encounter for screening for malignant neoplasm of colon: Secondary | ICD-10-CM

## 2018-09-29 ENCOUNTER — Other Ambulatory Visit: Payer: Self-pay

## 2018-09-29 ENCOUNTER — Ambulatory Visit: Payer: BC Managed Care – PPO | Admitting: *Deleted

## 2018-09-29 VITALS — Ht 67.0 in | Wt 220.0 lb

## 2018-09-29 DIAGNOSIS — Z1211 Encounter for screening for malignant neoplasm of colon: Secondary | ICD-10-CM

## 2018-09-29 MED ORDER — SUPREP BOWEL PREP KIT 17.5-3.13-1.6 GM/177ML PO SOLN: 1.0000 | Freq: Once | ORAL | 0 refills | Status: AC

## 2018-09-29 NOTE — Progress Notes (Signed)
No egg or soy allergy known to patient  No issues with past sedation with any surgeries  or procedures, no past intubation  No diet pills per patient No home 02 use per patient  No blood thinners per patient  Pt denies issues with constipation  No A fib or A flutter  EMMI video sent to pt's e mail   Pt verified name, DOB, address and insurance during PV today. Pt mailed instruction packet to included paper to complete and mail back to [facility] with addressed and stamped envelope, Emmi video, copy of consent form to read and not return, and instructions. [employer] $15  coupon mailed in packet. PV completed over the phone. Pt encouraged to call with questions or issues   Pt is aware that care partner will wait in the car during proceudre; if they feel like they will be too hot to wait in the car; they may wait in the lobby.  We want them to wear a mask (we do not have any that we can provide them), practice social distancing, and we will check their temperatures when they get here.  I did remind patient that their care partner needs to stay in the parking lot the entire time. Pt will wear mask into building.

## 2018-10-01 ENCOUNTER — Other Ambulatory Visit: Payer: Self-pay

## 2018-10-01 ENCOUNTER — Encounter: Payer: Self-pay | Admitting: Family Medicine

## 2018-10-01 ENCOUNTER — Encounter: Payer: Self-pay | Admitting: Gastroenterology

## 2018-10-01 ENCOUNTER — Other Ambulatory Visit: Payer: Self-pay | Admitting: Family Medicine

## 2018-10-01 DIAGNOSIS — F411 Generalized anxiety disorder: Secondary | ICD-10-CM

## 2018-10-01 MED ORDER — ALPRAZOLAM 0.25 MG PO TABS: 0.2500 mg | ORAL_TABLET | Freq: Three times a day (TID) | ORAL | 0 refills | Status: DC | PRN

## 2018-10-01 NOTE — Progress Notes (Unsigned)
Requesting: Xanax Contract: 03/25/2017 UDS:03/25/2017, low risk, 02/2018 Last OV: 07/18/2018 Next OV: 01/20/2019 Last Refill:07/18/2018, #30--1 RF Database:   Please advise

## 2018-10-01 NOTE — Progress Notes (Signed)
I have done a refill but she will need a visit for further refills

## 2018-10-10 ENCOUNTER — Telehealth: Payer: Self-pay | Admitting: Gastroenterology

## 2018-10-10 NOTE — Telephone Encounter (Signed)

## 2018-10-13 ENCOUNTER — Ambulatory Visit (AMBULATORY_SURGERY_CENTER): Payer: BC Managed Care – PPO | Admitting: Gastroenterology

## 2018-10-13 ENCOUNTER — Other Ambulatory Visit: Payer: Self-pay

## 2018-10-13 ENCOUNTER — Encounter: Payer: Self-pay | Admitting: Gastroenterology

## 2018-10-13 VITALS — BP 113/68 | HR 79 | Temp 99.1°F | Resp 14 | Ht 67.0 in | Wt 220.0 lb

## 2018-10-13 DIAGNOSIS — D128 Benign neoplasm of rectum: Secondary | ICD-10-CM

## 2018-10-13 DIAGNOSIS — Z1211 Encounter for screening for malignant neoplasm of colon: Secondary | ICD-10-CM

## 2018-10-13 DIAGNOSIS — K621 Rectal polyp: Secondary | ICD-10-CM

## 2018-10-13 MED ORDER — SODIUM CHLORIDE 0.9 % IV SOLN: 500.0000 mL | Freq: Once | INTRAVENOUS | Status: DC

## 2018-10-13 NOTE — Progress Notes (Signed)
Called to room to assist during endoscopic procedure.  Patient ID and intended procedure confirmed with present staff. Received instructions for my participation in the procedure from the performing physician.  

## 2018-10-13 NOTE — Progress Notes (Signed)
Temperature taken by Fayrene Fearing, CMA, VS taken by Rica Mote, CMA

## 2018-10-13 NOTE — Op Note (Signed)
[facility] Patient Name: Kelli Evans Procedure Date: 10/13/2018 1:29 PM MRN: [id number] Endoscopist: Mallie Mussel L. Loletha Carrow , MD Age: 53 Referring MD:  Date of Birth: [birth year] Gender: Female Account #: [id number] Procedure:                Colonoscopy Indications:              Screening for colorectal malignant neoplasm, This                            is the patient's first colonoscopy Medicines:                Monitored Anesthesia Care Procedure:                Pre-Anesthesia Assessment:                           - Prior to the procedure, a History and Physical                            was performed, and patient medications and                            allergies were reviewed. The patient's tolerance of                            previous anesthesia was also reviewed. The risks                            and benefits of the procedure and the sedation                            options and risks were discussed with the patient.                            All questions were answered, and informed consent                            was obtained. Prior Anticoagulants: The patient has                            taken no previous anticoagulant or antiplatelet                            agents. ASA Grade Assessment: II - A patient with                            mild systemic disease. After reviewing the risks                            and benefits, the patient was deemed in                            satisfactory condition to undergo the procedure.  After obtaining informed consent, the colonoscope                            was passed under direct vision. Throughout the                            procedure, the patient's blood pressure, pulse, and                            oxygen saturations were monitored continuously. The                            Colonoscope was introduced through the anus and                            advanced to the the cecum,  identified by                            appendiceal orifice and ileocecal valve. The                            colonoscopy was performed without difficulty. The                            patient tolerated the procedure well. The quality                            of the bowel preparation was good. The ileocecal                            valve, appendiceal orifice, and rectum were                            photographed. Scope In: 1:37:20 PM Scope Out: 1:51:26 PM Scope Withdrawal Time: 0 hours 11 minutes 30 seconds  Total Procedure Duration: 0 hours 14 minutes 6 seconds  Findings:                 The perianal and digital rectal examinations were                            normal.                           Multiple small-mouthed diverticula were found in                            the sigmoid colon.                           A diminutive polyp was found in the rectum. The                            polyp was sessile. The polyp was removed with a  cold biopsy forceps. Resection and retrieval were                            complete.                           The exam was otherwise without abnormality on                            direct and retroflexion views. Complications:            No immediate complications. Estimated Blood Loss:     Estimated blood loss was minimal. Impression:               - Diverticulosis in the sigmoid colon.                           - One diminutive polyp in the rectum, removed with                            a cold biopsy forceps. Resected and retrieved.                           - The examination was otherwise normal on direct                            and retroflexion views. Recommendation:           - Patient has a contact number available for                            emergencies. The signs and symptoms of potential                            delayed complications were discussed with the                            patient.  Return to normal activities tomorrow.                            Written discharge instructions were provided to the                            patient.                           - Resume previous diet.                           - Continue present medications.                           - Await pathology results.                           - Repeat colonoscopy is recommended for  surveillance. The colonoscopy date will be                            determined after pathology results from today's                            exam become available for review.  L. Loletha Carrow, MD 10/13/2018 1:54:46 PM This report has been signed electronically.

## 2018-10-13 NOTE — Patient Instructions (Signed)
Information on polyps & diverticulosis given to you today   Await pathology results on polyp removed in a letter from Dr Loletha Carrow   YOU HAD AN ENDOSCOPIC PROCEDURE TODAY AT THE [facility]:   Refer to the procedure report that was given to you for any specific questions about what was found during the examination.  If the procedure report does not answer your questions, please call your gastroenterologist to clarify.  If you requested that your care partner not be given the details of your procedure findings, then the procedure report has been included in a sealed envelope for you to review at your convenience later.  YOU SHOULD EXPECT: Some feelings of bloating in the abdomen. Passage of more gas than usual.  Walking can help get rid of the air that was put into your GI tract during the procedure and reduce the bloating. If you had a lower endoscopy (such as a colonoscopy or flexible sigmoidoscopy) you may notice spotting of blood in your stool or on the toilet paper. If you underwent a bowel prep for your procedure, you may not have a normal bowel movement for a few days.  Please Note:  You might notice some irritation and congestion in your nose or some drainage.  This is from the oxygen used during your procedure.  There is no need for concern and it should clear up in a day or so.  SYMPTOMS TO REPORT IMMEDIATELY:   Following lower endoscopy (colonoscopy or flexible sigmoidoscopy):  Excessive amounts of blood in the stool  Significant tenderness or worsening of abdominal pains  Swelling of the abdomen that is new, acute  Fever of 100F or higher    For urgent or emergent issues, a gastroenterologist can be reached at any hour by calling [phone].   DIET:  We do recommend a small meal at first, but then you may proceed to your regular diet.  Drink plenty of fluids but you should avoid alcoholic beverages for 24 hours.  ACTIVITY:  You should plan to take it easy for  the rest of today and you should NOT DRIVE or use heavy machinery until tomorrow (because of the sedation medicines used during the test).    FOLLOW UP: Our staff will call the number listed on your records 48-72 hours following your procedure to check on you and address any questions or concerns that you may have regarding the information given to you following your procedure. If we do not reach you, we will leave a message.  We will attempt to reach you two times.  During this call, we will ask if you have developed any symptoms of COVID 19. If you develop any symptoms (ie: fever, flu-like symptoms, shortness of breath, cough etc.) before then, please call [phone].  If you test positive for Covid 19 in the 2 weeks post procedure, please call and report this information to [place].    If any biopsies were taken you will be contacted by phone or by letter within the next 1-3 weeks.  Please call us at [phone] if you have not heard about the biopsies in 3 weeks.    SIGNATURES/CONFIDENTIALITY: You and/or your care partner have signed paperwork which will be entered into your electronic medical record.  These signatures attest to the fact that that the information above on your After Visit Summary has been reviewed and is understood.  Full responsibility of the confidentiality of this discharge information lies with you and/or your care-partner.

## 2018-10-15 ENCOUNTER — Telehealth: Payer: Self-pay

## 2018-10-15 NOTE — Telephone Encounter (Signed)
  Follow up Call-  Call back number 10/13/2018  Post procedure Call Back phone  # [phone]  Permission to leave phone message Yes  Some recent data might be hidden     Patient questions:  Do you have a fever, pain , or abdominal swelling? No. Pain Score  0 *  Have you tolerated food without any problems? Yes.    Have you been able to return to your normal activities? Yes.    Do you have any questions about your discharge instructions: Diet   No. Medications  No. Follow up visit  No.  Do you have questions or concerns about your Care? No.  Actions: * If pain score is 4 or above: No action needed, pain <4.  1. Have you developed a fever since your procedure? no  2.   Have you had an respiratory symptoms (SOB or cough) since your procedure?no  3.   Have you tested positive for COVID 19 since your procedure no  4.   Have you had any family members/close contacts diagnosed with the COVID 19 since your procedure? no   If yes to any of these questions please route to Joylene John, RN and Alphonsa Gin, Therapist, sports.

## 2018-10-20 ENCOUNTER — Encounter: Payer: Self-pay | Admitting: Gastroenterology

## 2018-10-26 ENCOUNTER — Other Ambulatory Visit: Payer: Self-pay | Admitting: Family Medicine

## 2018-10-26 DIAGNOSIS — F411 Generalized anxiety disorder: Secondary | ICD-10-CM

## 2018-10-28 NOTE — Telephone Encounter (Signed)
Requesting:xanax Contract:yes UDS:n/a Last OV:07/18/18 Next OV:01/20/19 Last Refill: Database:10/01/18  #[id number]   Please advise

## 2018-10-30 ENCOUNTER — Other Ambulatory Visit: Payer: Self-pay | Admitting: Family Medicine

## 2018-10-30 ENCOUNTER — Encounter: Payer: Self-pay | Admitting: Family Medicine

## 2018-10-30 DIAGNOSIS — F411 Generalized anxiety disorder: Secondary | ICD-10-CM

## 2018-11-28 ENCOUNTER — Telehealth: Payer: Self-pay | Admitting: *Deleted

## 2018-11-28 ENCOUNTER — Other Ambulatory Visit: Payer: Self-pay | Admitting: Family Medicine

## 2018-11-28 DIAGNOSIS — F411 Generalized anxiety disorder: Secondary | ICD-10-CM

## 2018-11-28 MED ORDER — ALPRAZOLAM 0.25 MG PO TABS: ORAL_TABLET | ORAL | 1 refills | Status: DC

## 2018-11-28 NOTE — Telephone Encounter (Signed)
done

## 2018-11-28 NOTE — Telephone Encounter (Signed)
[facility] REQUESTING REFILL ON ALPRAZOLAM.  Last written: 10/28/18 Last ov: 07/18/18 Next ov:  01/20/19 Contract: WILL GET AT NEXT VISIT UDS: WILL GET AT NEXT VISIT

## 2019-01-16 ENCOUNTER — Other Ambulatory Visit: Payer: Self-pay | Admitting: Family Medicine

## 2019-01-16 DIAGNOSIS — Z1231 Encounter for screening mammogram for malignant neoplasm of breast: Secondary | ICD-10-CM

## 2019-01-19 ENCOUNTER — Other Ambulatory Visit: Payer: Self-pay

## 2019-01-20 ENCOUNTER — Ambulatory Visit: Payer: BC Managed Care – PPO | Admitting: Family Medicine

## 2019-01-20 ENCOUNTER — Encounter: Payer: Self-pay | Admitting: Family Medicine

## 2019-01-20 VITALS — BP 128/88 | HR 86 | Temp 97.2°F | Resp 18 | Ht 67.0 in | Wt 210.0 lb

## 2019-01-20 DIAGNOSIS — E039 Hypothyroidism, unspecified: Secondary | ICD-10-CM

## 2019-01-20 DIAGNOSIS — E785 Hyperlipidemia, unspecified: Secondary | ICD-10-CM

## 2019-01-20 DIAGNOSIS — Z23 Encounter for immunization: Secondary | ICD-10-CM | POA: Diagnosis not present

## 2019-01-20 DIAGNOSIS — R739 Hyperglycemia, unspecified: Secondary | ICD-10-CM

## 2019-01-20 DIAGNOSIS — Z79899 Other long term (current) drug therapy: Secondary | ICD-10-CM | POA: Diagnosis not present

## 2019-01-20 DIAGNOSIS — M25511 Pain in right shoulder: Secondary | ICD-10-CM | POA: Diagnosis not present

## 2019-01-20 DIAGNOSIS — F418 Other specified anxiety disorders: Secondary | ICD-10-CM

## 2019-01-20 DIAGNOSIS — G8929 Other chronic pain: Secondary | ICD-10-CM

## 2019-01-20 MED ORDER — PREDNISONE 10 MG PO TABS: ORAL_TABLET | ORAL | 0 refills | Status: DC

## 2019-01-20 MED ORDER — METHOCARBAMOL 500 MG PO TABS: 500.0000 mg | ORAL_TABLET | Freq: Four times a day (QID) | ORAL | 0 refills | Status: DC | PRN

## 2019-01-20 NOTE — Progress Notes (Signed)
Patient ID: Kelli Evans, female    DOB: 1965/12/13  Age: 53 y.o. MRN: [id number]    Subjective:  Subjective  HPI Kelli Evans presents for f/u anxiety and she c/o R shoulder pain x 1 year   Review of Systems  Constitutional: Negative for appetite change, diaphoresis, fatigue and unexpected weight change.  Eyes: Negative for pain, redness and visual disturbance.  Respiratory: Negative for cough, chest tightness, shortness of breath and wheezing.   Cardiovascular: Negative for chest pain, palpitations and leg swelling.  Endocrine: Negative for cold intolerance, heat intolerance, polydipsia, polyphagia and polyuria.  Genitourinary: Negative for difficulty urinating, dysuria and frequency.  Neurological: Negative for dizziness, light-headedness, numbness and headaches.  Psychiatric/Behavioral: Negative for decreased concentration, self-injury, sleep disturbance and suicidal ideas. The patient is not nervous/anxious.     History Past Medical History:  Diagnosis Date  . Allergy   . Anemia    past hx of anemia 2019  . Anxiety   . GERD (gastroesophageal reflux disease)   . Hyperlipidemia    no meds  . Hypothyroidism    past hx     She has a past surgical history that includes Wisdom tooth extraction.   Her family history includes Brain cancer in her father; Breast cancer in her maternal grandfather and mother; Cancer in her father; Cancer (age of onset: 41) in her cousin; Cancer (age of onset: 54) in her mother; Colon polyps in her father and mother; Heart disease in her father; Hyperlipidemia in her father; Hypertension in her father; Hypothyroidism in her father; Other in her sister.She reports that she has never smoked. She has never used smokeless tobacco. She reports current alcohol use. She reports that she does not use drugs.  Current Outpatient Medications on File Prior to Visit  Medication Sig Dispense Refill  . ALPRAZolam (XANAX) 0.25 MG tablet TAKE 1 TABLET(0.25 MG) BY  MOUTH THREE TIMES DAILY AS NEEDED 30 tablet 1  . Multiple Vitamins-Minerals (ONE-A-DAY 50 PLUS PO) Take by mouth daily.    Marland Kitchen omeprazole (PRILOSEC) 20 MG capsule TAKE 1 CAPSULE BY MOUTH EVERY DAY 90 capsule 1  . spironolactone (ALDACTONE) 25 MG tablet Take 1 tablet (25 mg total) by mouth 2 (two) times daily. 180 tablet 3  . venlafaxine XR (EFFEXOR-XR) 150 MG 24 hr capsule Take 1 capsule (150 mg total) by mouth daily. 90 capsule 3  . ALLERGY 10 MG tablet TAKE 1 TABLET BY MOUTH DAILY (Patient not taking: Claritin) 30 tablet 3   Current Facility-Administered Medications on File Prior to Visit  Medication Dose Route Frequency Provider Last Rate Last Dose  . betamethasone acetate-betamethasone sodium phosphate (CELESTONE) injection 3 mg  3 mg Intramuscular Once Daylene Katayama M, DPM      . betamethasone acetate-betamethasone sodium phosphate (CELESTONE) injection 3 mg  3 mg Intramuscular Once Edrick Kins, DPM         Objective:  Objective  Physical Exam Vitals signs and nursing note reviewed.  Constitutional:      Appearance: She is well-developed.  HENT:     Head: Normocephalic and atraumatic.  Eyes:     Conjunctiva/sclera: Conjunctivae normal.  Neck:     Musculoskeletal: Normal range of motion and neck supple.     Thyroid: No thyromegaly.     Vascular: No carotid bruit or JVD.  Cardiovascular:     Rate and Rhythm: Normal rate and regular rhythm.     Heart sounds: Normal heart sounds. No murmur.  Pulmonary:  Effort: Pulmonary effort is normal. No respiratory distress.     Breath sounds: Normal breath sounds. No wheezing or rales.  Chest:     Chest wall: No tenderness.  Musculoskeletal:        General: Tenderness and signs of injury present.     Right shoulder: She exhibits decreased range of motion, tenderness, pain and spasm.  Neurological:     Mental Status: She is alert and oriented to person, place, and time.  Psychiatric:        Attention and Perception: Attention normal.         Mood and Affect: Mood and affect normal.        Speech: Speech normal.        Behavior: Behavior normal.        Thought Content: Thought content normal.        Cognition and Memory: Cognition normal.    BP 128/88 (BP Location: Left Arm, Patient Position: Sitting, Cuff Size: Normal)   Pulse 86   Temp (!) 97.2 F (36.2 C) (Temporal)   Resp 18   Ht 5\' 7"  (1.702 m)   Wt 210 lb (95.3 kg)   SpO2 99%   BMI 32.89 kg/m  Wt Readings from Last 3 Encounters:  01/20/19 210 lb (95.3 kg)  10/13/18 220 lb (99.8 kg)  09/29/18 220 lb (99.8 kg)     Lab Results  Component Value Date   WBC 7.3 03/25/2017   HGB 14.4 03/25/2017   HCT 43.3 03/25/2017   PLT 283.0 03/25/2017   GLUCOSE 154 (H) 07/22/2018   CHOL 235 (H) 07/22/2018   TRIG 122.0 07/22/2018   HDL 51.40 07/22/2018   LDLDIRECT 151.6 10/31/2012   LDLCALC 159 (H) 07/22/2018   ALT 20 07/22/2018   AST 14 07/22/2018   NA 136 07/22/2018   K 4.2 07/22/2018   CL 100 07/22/2018   CREATININE 0.69 07/22/2018   BUN 9 07/22/2018   CO2 28 07/22/2018   TSH 5.48 (H) 07/22/2018    No results found.   Assessment & Plan:  Plan  I am having Kelli Evans "Kelli Evans" start on predniSONE and methocarbamol. I am also having her maintain her Allergy, venlafaxine XR, spironolactone, omeprazole, Multiple Vitamins-Minerals (ONE-A-DAY 50 PLUS PO), and ALPRAZolam. We will continue to administer betamethasone acetate-betamethasone sodium phosphate and betamethasone acetate-betamethasone sodium phosphate.  Meds ordered this encounter  Medications  . predniSONE (DELTASONE) 10 MG tablet    Sig: TAKE 3 TABLETS PO QD FOR 3 DAYS THEN TAKE 2 TABLETS PO QD FOR 3 DAYS THEN TAKE 1 TABLET PO QD FOR 3 DAYS THEN TAKE 1/2 TAB PO QD FOR 3 DAYS    Dispense:  20 tablet    Refill:  0  . methocarbamol (ROBAXIN) 500 MG tablet    Sig: Take 1 tablet (500 mg total) by mouth every 6 (six) hours as needed for muscle spasms.    Dispense:  45 tablet    Refill:  0     Problem List Items Addressed This Visit      Unprioritized   Chronic right shoulder pain    Muscle relaxer  pred taper Ortho if no improvement       Relevant Medications   predniSONE (DELTASONE) 10 MG tablet   methocarbamol (ROBAXIN) 500 MG tablet   Depression with anxiety    Stable con't meds      Hyperlipidemia    Encouraged heart healthy diet, increase exercise, avoid trans fats, consider a krill oil cap  daily      Hypothyroidism    Stable con't meds       Relevant Orders   Comprehensive metabolic panel   TSH    Other Visit Diagnoses    Need for influenza vaccination    -  Primary   Relevant Orders   Flu Vaccine QUAD 6+ mos PF IM (Fluarix Quad PF) (Completed)   High risk medication use       Relevant Orders   Pain Mgmt, Profile 8 w/Conf, U   Hyperglycemia       Relevant Orders   Lipid panel   Comprehensive metabolic panel   Hemoglobin A1c      Follow-up: Return in about 6 months (around 07/21/2019), or if symptoms worsen or fail to improve, for annual exam, fasting.  Ann Held, DO

## 2019-01-21 DIAGNOSIS — G8929 Other chronic pain: Secondary | ICD-10-CM | POA: Insufficient documentation

## 2019-01-21 LAB — PAIN MGMT, PROFILE 8 W/CONF, U
6 Acetylmorphine: NEGATIVE ng/mL
Alcohol Metabolites: NEGATIVE ng/mL (ref <500)
Amphetamines: NEGATIVE ng/mL
Benzodiazepines: NEGATIVE ng/mL
Buprenorphine, Urine: NEGATIVE ng/mL
Cocaine Metabolite: NEGATIVE ng/mL
Creatinine: 173.8 mg/dL
MDMA: NEGATIVE ng/mL
Marijuana Metabolite: NEGATIVE ng/mL
Opiates: NEGATIVE ng/mL
Oxidant: NEGATIVE ug/mL
Oxycodone: NEGATIVE ng/mL
pH: 5.1 (ref 4.5–9.0)

## 2019-01-21 LAB — COMPREHENSIVE METABOLIC PANEL
ALT: 21 U/L (ref 0–35)
AST: 17 U/L (ref 0–37)
Albumin: 4.6 g/dL (ref 3.5–5.2)
Alkaline Phosphatase: 66 U/L (ref 39–117)
BUN: 10 mg/dL (ref 6–23)
CO2: 27 mEq/L (ref 19–32)
Calcium: 9.6 mg/dL (ref 8.4–10.5)
Chloride: 100 mEq/L (ref 96–112)
Creatinine, Ser: 0.72 mg/dL (ref 0.40–1.20)
GFR: 84.75 mL/min (ref >60.00)
Glucose, Bld: 114 mg/dL — ABNORMAL HIGH (ref 70–99)
Potassium: 4.7 mEq/L (ref 3.5–5.1)
Sodium: 138 mEq/L (ref 135–145)
Total Bilirubin: 0.6 mg/dL (ref 0.2–1.2)
Total Protein: 7.1 g/dL (ref 6.0–8.3)

## 2019-01-21 LAB — LIPID PANEL
Cholesterol: 234 mg/dL — ABNORMAL HIGH (ref 0–200)
HDL: 48.1 mg/dL (ref >39.00)
LDL Cholesterol: 166 mg/dL — ABNORMAL HIGH (ref 0–99)
NonHDL: 185.41
Total CHOL/HDL Ratio: 5
Triglycerides: 95 mg/dL (ref 0.0–149.0)
VLDL: 19 mg/dL (ref 0.0–40.0)

## 2019-01-21 LAB — HEMOGLOBIN A1C: Hgb A1c MFr Bld: 8.7 % — ABNORMAL HIGH (ref 4.6–6.5)

## 2019-01-21 NOTE — Assessment & Plan Note (Signed)
Stable con't meds 

## 2019-01-21 NOTE — Assessment & Plan Note (Signed)
Muscle relaxer  pred taper Ortho if no improvement

## 2019-01-21 NOTE — Assessment & Plan Note (Signed)
Encouraged heart healthy diet, increase exercise, avoid trans fats, consider a krill oil cap daily 

## 2019-01-22 LAB — TSH: TSH: 2.3 u[IU]/mL (ref 0.35–4.50)

## 2019-01-23 ENCOUNTER — Other Ambulatory Visit: Payer: Self-pay | Admitting: Family Medicine

## 2019-01-23 DIAGNOSIS — E785 Hyperlipidemia, unspecified: Secondary | ICD-10-CM

## 2019-01-23 DIAGNOSIS — E1165 Type 2 diabetes mellitus with hyperglycemia: Secondary | ICD-10-CM

## 2019-01-23 DIAGNOSIS — E1169 Type 2 diabetes mellitus with other specified complication: Secondary | ICD-10-CM

## 2019-01-26 ENCOUNTER — Other Ambulatory Visit: Payer: Self-pay

## 2019-01-26 ENCOUNTER — Encounter: Payer: Self-pay | Admitting: Family Medicine

## 2019-01-26 DIAGNOSIS — E1165 Type 2 diabetes mellitus with hyperglycemia: Secondary | ICD-10-CM

## 2019-01-26 MED ORDER — METFORMIN HCL 500 MG PO TABS: 500.0000 mg | ORAL_TABLET | Freq: Two times a day (BID) | ORAL | 2 refills | Status: DC

## 2019-01-26 MED ORDER — ONETOUCH VERIO W/DEVICE KIT: PACK | 0 refills | Status: DC

## 2019-01-26 MED ORDER — ONETOUCH VERIO VI STRP: ORAL_STRIP | 1 refills | Status: AC

## 2019-01-26 MED ORDER — ONETOUCH DELICA LANCETS 33G MISC: 1 refills | Status: AC

## 2019-01-26 NOTE — Addendum Note (Signed)
Addended by: Kem Boroughs D on: 01/26/2019 11:40 AM   Modules accepted: Orders

## 2019-02-10 ENCOUNTER — Encounter: Payer: BC Managed Care – PPO | Attending: Family Medicine | Admitting: Dietician

## 2019-02-10 ENCOUNTER — Other Ambulatory Visit: Payer: Self-pay

## 2019-02-10 DIAGNOSIS — E119 Type 2 diabetes mellitus without complications: Secondary | ICD-10-CM | POA: Diagnosis present

## 2019-02-12 ENCOUNTER — Encounter: Payer: Self-pay | Admitting: Dietician

## 2019-02-12 NOTE — Progress Notes (Signed)
Patient was seen on 02/10/2019 for the first of a series of three diabetes self-management courses at the [facility].  Patient Education Plan per assessed needs and concerns is to attend three course education program for Diabetes Self Management Education.  The following learning objectives were met by the patient during this class:  Describe diabetes, types of diabetes and pathophysiology  State some common risk factors for diabetes  Defines the role of glucose and insulin  Describe the relationship between diabetes and cardiovascular and other risks  State the members of the Healthcare Team  States the rationale for glucose monitoring and when to test  State their individual [place] the importance of logging glucose readings and how to interpret the readings  Identifies A1C target  Explain the correlation between A1c and eAG values  State symptoms and treatment of high blood glucose and low blood glucose  Explain proper technique for glucose testing and identify proper sharps disposal  Handouts given during class include:  How to Thrive:  A Guide for Your Journey with Diabetes by the ADA  Meal Plan Card and carbohydrate content list  Dietary intake form  Low Sodium Flavoring Tips  Types of Fats  Dining Out  Label reading  Snack list  Planning a balanced meal  The diabetes portion plate  Diabetes Resources  A1c to eAG Conversion Chart  Blood Glucose Log  Diabetes Recommended Care Schedule  Support Group  Diabetes Success Plan  Core Class Satisfaction Survey   Follow-Up Plan:  Attend core 2

## 2019-02-17 ENCOUNTER — Other Ambulatory Visit: Payer: Self-pay

## 2019-02-17 ENCOUNTER — Encounter: Payer: BC Managed Care – PPO | Admitting: Dietician

## 2019-02-17 ENCOUNTER — Encounter: Payer: Self-pay | Admitting: Dietician

## 2019-02-17 DIAGNOSIS — E119 Type 2 diabetes mellitus without complications: Secondary | ICD-10-CM | POA: Diagnosis not present

## 2019-02-17 NOTE — Progress Notes (Signed)
Patient was seen on 02/17/2019 for the second of a series of three diabetes self-management courses at the [facility].   She states that she is now checking her BG.  She was provided a list of counselors with notes related to insurance as well as a Air traffic controller card.  The following learning objectives were met by the patient during this class:   Describe the role of different macronutrients on glucose  Explain how carbohydrates affect blood glucose  State what foods contain the most carbohydrates  Demonstrate carbohydrate counting  Demonstrate how to read Nutrition Facts food label  Describe effects of various fats on heart health  Describe the importance of good nutrition for health and healthy eating strategies  Describe techniques for managing your shopping, cooking and meal planning  List strategies to follow meal plan when dining out  Describe the effects of alcohol on glucose and how to use it safely  Goals:  Follow Diabetes Meal Plan as instructed  Aim to spread carbs evenly throughout the day  Aim for 3 meals per day and snacks as needed Include lean protein foods to meals/snacks  Monitor glucose levels as instructed by your doctor   Follow-Up Plan:  Attend Core 3  Work towards following your personal food plan.

## 2019-02-18 ENCOUNTER — Ambulatory Visit: Payer: BC Managed Care – PPO

## 2019-02-24 ENCOUNTER — Encounter: Payer: Self-pay | Admitting: Dietician

## 2019-02-24 ENCOUNTER — Other Ambulatory Visit: Payer: Self-pay

## 2019-02-24 ENCOUNTER — Encounter: Payer: BC Managed Care – PPO | Attending: Family Medicine | Admitting: Dietician

## 2019-02-24 DIAGNOSIS — E119 Type 2 diabetes mellitus without complications: Secondary | ICD-10-CM | POA: Diagnosis not present

## 2019-02-24 NOTE — Progress Notes (Signed)
Patient was seen on 02/24/2019 for the third of a series of three diabetes self-management courses at the [facility].   Kelli Evans the amount of activity recommended for healthy living . Describe activities suitable for individual needs . Identify ways to regularly incorporate activity into daily life . Identify barriers to activity and ways to over come these barriers  Identify diabetes medications being personally used and their primary action for lowering glucose and possible side effects . Describe role of stress on blood glucose and develop strategies to address psychosocial issues . Identify diabetes complications and ways to prevent them  Explain how to manage diabetes during illness . Evaluate success in meeting personal goal . Establish 2-3 goals that they will plan to diligently work on  Goals:   I will be active 30 minutes or more 5 times a week  I will eat less unhealthy fats by eating more non starchy vegetables at every meal  I will look at patterns in my record book at least 20 days a month  Your patient has identified these potential barriers to change:  Motivation Stress  Your patient has identified their diabetes self-care support plan as  On-line Resources  American Diabetes Association Website    Plan:  Attend Support Group as desired

## 2019-03-06 ENCOUNTER — Ambulatory Visit
Admission: RE | Admit: 2019-03-06 | Discharge: 2019-03-06 | Disposition: A | Discharge status: Home / Self Care | Payer: BC Managed Care – PPO | Source: Ambulatory Visit | Attending: Family Medicine | Admitting: Family Medicine

## 2019-03-06 ENCOUNTER — Other Ambulatory Visit: Payer: Self-pay

## 2019-03-06 DIAGNOSIS — Z1231 Encounter for screening mammogram for malignant neoplasm of breast: Secondary | ICD-10-CM

## 2019-03-25 ENCOUNTER — Other Ambulatory Visit: Payer: Self-pay | Admitting: Family Medicine

## 2019-03-25 ENCOUNTER — Telehealth: Payer: Self-pay | Admitting: *Deleted

## 2019-03-25 DIAGNOSIS — F411 Generalized anxiety disorder: Secondary | ICD-10-CM

## 2019-03-25 MED ORDER — ALPRAZOLAM 0.25 MG PO TABS: ORAL_TABLET | ORAL | 1 refills | Status: DC

## 2019-03-25 NOTE — Telephone Encounter (Signed)
For alprazolam.

## 2019-03-25 NOTE — Telephone Encounter (Signed)
For xanax or robaxin?

## 2019-03-25 NOTE — Telephone Encounter (Signed)
Last RX:  11/28/18, #30 x 1 refill Last OV: 01/20/19 Next OV:  07/24/19 UDS:  01/20/19 CSC:  01/20/19

## 2019-04-20 ENCOUNTER — Other Ambulatory Visit: Payer: Self-pay | Admitting: *Deleted

## 2019-04-20 MED ORDER — METFORMIN HCL 500 MG PO TABS: 500.0000 mg | ORAL_TABLET | Freq: Two times a day (BID) | ORAL | 2 refills | Status: DC

## 2019-05-22 ENCOUNTER — Encounter: Payer: Self-pay | Admitting: Family Medicine

## 2019-06-03 ENCOUNTER — Other Ambulatory Visit: Payer: BC Managed Care – PPO

## 2019-06-09 ENCOUNTER — Other Ambulatory Visit: Payer: Self-pay

## 2019-06-10 ENCOUNTER — Other Ambulatory Visit (INDEPENDENT_AMBULATORY_CARE_PROVIDER_SITE_OTHER): Payer: BC Managed Care – PPO

## 2019-06-10 DIAGNOSIS — E1165 Type 2 diabetes mellitus with hyperglycemia: Secondary | ICD-10-CM

## 2019-06-10 DIAGNOSIS — E785 Hyperlipidemia, unspecified: Secondary | ICD-10-CM

## 2019-06-10 DIAGNOSIS — E1169 Type 2 diabetes mellitus with other specified complication: Secondary | ICD-10-CM

## 2019-06-11 ENCOUNTER — Encounter: Payer: Self-pay | Admitting: Family Medicine

## 2019-06-11 ENCOUNTER — Other Ambulatory Visit: Payer: Self-pay | Admitting: Family Medicine

## 2019-06-11 DIAGNOSIS — E1165 Type 2 diabetes mellitus with hyperglycemia: Secondary | ICD-10-CM

## 2019-06-11 DIAGNOSIS — E785 Hyperlipidemia, unspecified: Secondary | ICD-10-CM

## 2019-06-11 DIAGNOSIS — E1169 Type 2 diabetes mellitus with other specified complication: Secondary | ICD-10-CM

## 2019-06-11 LAB — COMPREHENSIVE METABOLIC PANEL
ALT: 15 U/L (ref 0–35)
AST: 14 U/L (ref 0–37)
Albumin: 4.4 g/dL (ref 3.5–5.2)
Alkaline Phosphatase: 66 U/L (ref 39–117)
BUN: 12 mg/dL (ref 6–23)
CO2: 30 mEq/L (ref 19–32)
Calcium: 10.3 mg/dL (ref 8.4–10.5)
Chloride: 98 mEq/L (ref 96–112)
Creatinine, Ser: 0.82 mg/dL (ref 0.40–1.20)
GFR: 72.83 mL/min (ref >60.00)
Glucose, Bld: 93 mg/dL (ref 70–99)
Potassium: 4.5 mEq/L (ref 3.5–5.1)
Sodium: 136 mEq/L (ref 135–145)
Total Bilirubin: 0.5 mg/dL (ref 0.2–1.2)
Total Protein: 7.1 g/dL (ref 6.0–8.3)

## 2019-06-11 LAB — HEMOGLOBIN A1C: Hgb A1c MFr Bld: 6.5 % (ref 4.6–6.5)

## 2019-06-11 LAB — LIPID PANEL
Cholesterol: 221 mg/dL — ABNORMAL HIGH (ref 0–200)
HDL: 49.2 mg/dL (ref >39.00)
LDL Cholesterol: 156 mg/dL — ABNORMAL HIGH (ref 0–99)
NonHDL: 171.62
Total CHOL/HDL Ratio: 4
Triglycerides: 78 mg/dL (ref 0.0–149.0)
VLDL: 15.6 mg/dL (ref 0.0–40.0)

## 2019-06-11 LAB — MICROALBUMIN / CREATININE URINE RATIO
Creatinine,U: 184.8 mg/dL
Microalb Creat Ratio: 0.5 mg/g (ref 0.0–30.0)
Microalb, Ur: 0.9 mg/dL (ref 0.0–1.9)

## 2019-06-11 MED ORDER — ROSUVASTATIN CALCIUM 10 MG PO TABS: 10.0000 mg | ORAL_TABLET | Freq: Every day | ORAL | 2 refills | Status: DC

## 2019-06-11 NOTE — Telephone Encounter (Signed)
She can take crestor with her other meds and yes she can take fish oil too

## 2019-06-11 NOTE — Addendum Note (Signed)
Addended byDamita Dunnings D on: 06/11/2019 03:16 PM   Modules accepted: Orders

## 2019-06-16 ENCOUNTER — Telehealth: Payer: Self-pay | Admitting: Family Medicine

## 2019-06-16 NOTE — Telephone Encounter (Signed)
Pt transferring to [place] office---- we can take orders out for labs here since she will be going over there

## 2019-06-16 NOTE — Telephone Encounter (Signed)
Fine with me

## 2019-06-16 NOTE — Telephone Encounter (Signed)
Patient would like to know if she could transfer from [facility] to [place], Please advise.

## 2019-06-17 NOTE — Telephone Encounter (Signed)
It's ok with me. Thank you.

## 2019-06-18 NOTE — Telephone Encounter (Signed)
Left pt vm to call back to schedule. 

## 2019-07-20 ENCOUNTER — Other Ambulatory Visit: Payer: Self-pay | Admitting: Family Medicine

## 2019-07-20 ENCOUNTER — Telehealth: Payer: Self-pay | Admitting: *Deleted

## 2019-07-20 DIAGNOSIS — F411 Generalized anxiety disorder: Secondary | ICD-10-CM

## 2019-07-20 MED ORDER — ALPRAZOLAM 0.25 MG PO TABS: ORAL_TABLET | ORAL | 1 refills | Status: DC

## 2019-07-20 NOTE — Telephone Encounter (Signed)
done

## 2019-07-20 NOTE — Telephone Encounter (Signed)
[facility] request refill for alprazolam  Last written: 03/25/19 Last ov: 01/20/19 Next ov: 09/25/18 Contract: 01/20/19 UDS: 01/20/19

## 2019-07-24 ENCOUNTER — Encounter: Payer: BC Managed Care – PPO | Admitting: Family Medicine

## 2019-08-11 ENCOUNTER — Encounter: Payer: Self-pay | Admitting: Family Medicine

## 2019-08-11 MED ORDER — METFORMIN HCL 500 MG PO TABS: 500.0000 mg | ORAL_TABLET | Freq: Two times a day (BID) | ORAL | 2 refills | Status: DC

## 2019-08-26 ENCOUNTER — Encounter: Payer: Self-pay | Admitting: Family Medicine

## 2019-08-26 DIAGNOSIS — F411 Generalized anxiety disorder: Secondary | ICD-10-CM

## 2019-08-26 DIAGNOSIS — L700 Acne vulgaris: Secondary | ICD-10-CM

## 2019-08-26 MED ORDER — SPIRONOLACTONE 25 MG PO TABS: 25.0000 mg | ORAL_TABLET | Freq: Two times a day (BID) | ORAL | 0 refills | Status: DC

## 2019-08-26 MED ORDER — VENLAFAXINE HCL ER 150 MG PO CP24: 150.0000 mg | ORAL_CAPSULE | Freq: Every day | ORAL | 0 refills | Status: DC

## 2019-09-25 ENCOUNTER — Ambulatory Visit (INDEPENDENT_AMBULATORY_CARE_PROVIDER_SITE_OTHER): Payer: BC Managed Care – PPO | Admitting: Family Medicine

## 2019-09-25 ENCOUNTER — Other Ambulatory Visit (HOSPITAL_COMMUNITY)
Admission: RE | Admit: 2019-09-25 | Discharge: 2019-09-25 | Disposition: A | Discharge status: Home / Self Care | Payer: BC Managed Care – PPO | Source: Ambulatory Visit | Attending: Family Medicine | Admitting: Family Medicine

## 2019-09-25 ENCOUNTER — Encounter: Payer: Self-pay | Admitting: Family Medicine

## 2019-09-25 ENCOUNTER — Other Ambulatory Visit: Payer: Self-pay

## 2019-09-25 VITALS — BP 108/80 | HR 62 | Temp 97.5°F | Resp 18 | Ht 67.0 in | Wt 187.6 lb

## 2019-09-25 DIAGNOSIS — E1165 Type 2 diabetes mellitus with hyperglycemia: Secondary | ICD-10-CM | POA: Diagnosis not present

## 2019-09-25 DIAGNOSIS — F411 Generalized anxiety disorder: Secondary | ICD-10-CM | POA: Diagnosis not present

## 2019-09-25 DIAGNOSIS — N841 Polyp of cervix uteri: Secondary | ICD-10-CM

## 2019-09-25 DIAGNOSIS — Z1159 Encounter for screening for other viral diseases: Secondary | ICD-10-CM

## 2019-09-25 DIAGNOSIS — Z0000 Encounter for general adult medical examination without abnormal findings: Secondary | ICD-10-CM | POA: Insufficient documentation

## 2019-09-25 DIAGNOSIS — E785 Hyperlipidemia, unspecified: Secondary | ICD-10-CM

## 2019-09-25 LAB — CBC WITH DIFFERENTIAL/PLATELET
Basophils Absolute: 0 10*3/uL (ref 0.0–0.1)
Basophils Relative: 0.8 % (ref 0.0–3.0)
Eosinophils Absolute: 0.2 10*3/uL (ref 0.0–0.7)
Eosinophils Relative: 3.1 % (ref 0.0–5.0)
HCT: 39.5 % (ref 36.0–46.0)
Hemoglobin: 13.3 g/dL (ref 12.0–15.0)
Lymphocytes Relative: 24.6 % (ref 12.0–46.0)
Lymphs Abs: 1.4 10*3/uL (ref 0.7–4.0)
MCHC: 33.7 g/dL (ref 30.0–36.0)
MCV: 84.6 fl (ref 78.0–100.0)
Monocytes Absolute: 0.4 10*3/uL (ref 0.1–1.0)
Monocytes Relative: 6.4 % (ref 3.0–12.0)
Neutro Abs: 3.8 10*3/uL (ref 1.4–7.7)
Neutrophils Relative %: 65.1 % (ref 43.0–77.0)
Platelets: 221 10*3/uL (ref 150.0–400.0)
RBC: 4.67 Mil/uL (ref 3.87–5.11)
RDW: 13.6 % (ref 11.5–15.5)
WBC: 5.8 10*3/uL (ref 4.0–10.5)

## 2019-09-25 LAB — LIPID PANEL
Cholesterol: 192 mg/dL (ref 0–200)
HDL: 56.2 mg/dL (ref >39.00)
LDL Cholesterol: 117 mg/dL — ABNORMAL HIGH (ref 0–99)
NonHDL: 136.26
Total CHOL/HDL Ratio: 3
Triglycerides: 94 mg/dL (ref 0.0–149.0)
VLDL: 18.8 mg/dL (ref 0.0–40.0)

## 2019-09-25 LAB — COMPREHENSIVE METABOLIC PANEL
ALT: 18 U/L (ref 0–35)
AST: 16 U/L (ref 0–37)
Albumin: 4.6 g/dL (ref 3.5–5.2)
Alkaline Phosphatase: 66 U/L (ref 39–117)
BUN: 13 mg/dL (ref 6–23)
CO2: 31 mEq/L (ref 19–32)
Calcium: 10.6 mg/dL — ABNORMAL HIGH (ref 8.4–10.5)
Chloride: 101 mEq/L (ref 96–112)
Creatinine, Ser: 0.74 mg/dL (ref 0.40–1.20)
GFR: 81.9 mL/min (ref >60.00)
Glucose, Bld: 90 mg/dL (ref 70–99)
Potassium: 4.5 mEq/L (ref 3.5–5.1)
Sodium: 140 mEq/L (ref 135–145)
Total Bilirubin: 0.7 mg/dL (ref 0.2–1.2)
Total Protein: 7 g/dL (ref 6.0–8.3)

## 2019-09-25 LAB — TSH: TSH: 2.26 u[IU]/mL (ref 0.35–4.50)

## 2019-09-25 LAB — HEMOGLOBIN A1C: Hgb A1c MFr Bld: 6.4 % (ref 4.6–6.5)

## 2019-09-25 MED ORDER — ROSUVASTATIN CALCIUM 10 MG PO TABS: 10.0000 mg | ORAL_TABLET | Freq: Every day | ORAL | 1 refills | Status: DC

## 2019-09-25 MED ORDER — ALPRAZOLAM 0.25 MG PO TABS: ORAL_TABLET | ORAL | 1 refills | Status: DC

## 2019-09-25 MED ORDER — VENLAFAXINE HCL ER 75 MG PO CP24: 75.0000 mg | ORAL_CAPSULE | Freq: Every day | ORAL | 1 refills | Status: DC

## 2019-09-25 MED ORDER — METFORMIN HCL 500 MG PO TABS: 500.0000 mg | ORAL_TABLET | Freq: Two times a day (BID) | ORAL | 3 refills | Status: DC

## 2019-09-25 NOTE — Progress Notes (Signed)
Subjective:     Kelli Evans is a 54 y.o. female and is here for a comprehensive physical exam. The patient reports needing f/u chol , bp and dm.   No other complaints.    HPI HYPERTENSION   Blood pressure range-good per pt   Chest pain- no      Dyspnea- no Lightheadedness- no   Edema- no  Other side effects - no   Medication compliance: good Low salt diet- yes    DIABETES    Blood Sugar ranges-good per pt   Polyuria- no New Visual problems- no  Hypoglycemic symptoms- no  Other side effects-no Medication compliance - good Last eye exam- due Foot exam- today   HYPERLIPIDEMIA  Medication compliance- good RUQ pain- no  Muscle aches- no Other side effects-no    .  Social History   Socioeconomic History  . Marital status: Single    Spouse name: Not on file  . Number of children: Not on file  . Years of education: Not on file  . Highest education level: Not on file  Occupational History  . 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 Smoking status: Never Smoker  . Smokeless tobacco: Never Used  Substance and Sexual Activity  . Alcohol use: Yes    Comment: once a month   . Drug use: No  . Sexual activity: Never  Other Topics Concern  . Not on file  Social History Narrative   Exercise-- walking the dog    Social Determinants of Health   Financial Resource Strain:   . Difficulty of Paying Living Expenses:   Food Insecurity:   . Worried About Charity fundraiser in the Last Year:   . Arboriculturist in the Last Year:   Transportation Needs:   . Film/video editor (Medical):   Marland Kitchen Lack of Transportation (Non-Medical):   Physical Activity:   . Days of Exercise per Week:   . Minutes of Exercise per Session:   Stress:   . Feeling of Stress :   Social Connections:   . Frequency of Communication with Friends and Family:   . Frequency of Social Gatherings with Friends and Family:   .  Attends Religious Services:   . Active Member of Clubs or Organizations:   . Attends Archivist Meetings:   Marland Kitchen Marital Status:   Intimate Partner Violence:   . Fear of Current or Ex-Partner:   . Emotionally Abused:   Marland Kitchen Physically Abused:   . Sexually Abused:    Health Maintenance  Topic Date Due  . Hepatitis C Screening  Never done  . COVID-19 Vaccine (1) Never done  . HIV Screening  Never done  . PAP SMEAR-Modifier  01/30/2019  . TETANUS/TDAP  10/04/2019  . INFLUENZA VACCINE  10/25/2019  . MAMMOGRAM  03/05/2020  . URINE MICROALBUMIN  06/09/2020  . COLONOSCOPY  10/12/2028    The following portions of the patient's history were reviewed and updated as appropriate:  She  has a past medical history of Allergy, Anemia, Anxiety, Diabetes mellitus without complication ([place]), GERD (gastroesophageal reflux disease), Hyperlipidemia, and Hypothyroidism. She does not have any pertinent problems on file. She  has a past surgical history that includes Wisdom tooth extraction. Her family history includes Brain cancer in her father; Breast cancer in her maternal grandfather and mother; Cancer in her father; Cancer (age of onset: 77) in her  cousin; Cancer (age of onset: 31) in her mother; Colon polyps in her father and mother; Heart disease in her father; Hyperlipidemia in her father; Hypertension in her father; Hypothyroidism in her father; Other in her sister. She  reports that she has never smoked. She has never used smokeless tobacco. She reports current alcohol use. She reports that she does not use drugs. She has a current medication list which includes the following prescription(s): alprazolam, onetouch verio, onetouch verio, metformin, multiple vitamins-minerals, omeprazole, onetouch delica lancets 97J, rosuvastatin, and venlafaxine xr, and the following Facility-Administered Medications: betamethasone acetate-betamethasone sodium phosphate and betamethasone acetate-betamethasone sodium  phosphate. Current Outpatient Medications on File Prior to Visit  Medication Sig Dispense Refill  . Blood Glucose Monitoring Suppl (ONETOUCH VERIO) w/Device KIT Use to check blood sugar once a day.  Dx Code: E11.9 1 kit 0  . glucose blood (ONETOUCH VERIO) test strip Use to check blood sugar once a day.  Dx Code: E11.9 100 each 1  . Multiple Vitamins-Minerals (ONE-A-DAY 50 PLUS PO) Take by mouth daily.    Marland Kitchen omeprazole (PRILOSEC) 20 MG capsule TAKE 1 CAPSULE BY MOUTH EVERY DAY 90 capsule 1  . OneTouch Delica Lancets 88T MISC Use to check blood sugar once a day.  Dx Code: E11.9 100 each 1   Current Facility-Administered Medications on File Prior to Visit  Medication Dose Route Frequency Provider Last Rate Last Admin  . betamethasone acetate-betamethasone sodium phosphate (CELESTONE) injection 3 mg  3 mg Intramuscular Once Daylene Katayama M, DPM      . betamethasone acetate-betamethasone sodium phosphate (CELESTONE) injection 3 mg  3 mg Intramuscular Once Edrick Kins, DPM       She has No Known Allergies..  Review of Systems Review of Systems  Constitutional: Negative for activity change, appetite change and fatigue.  HENT: Negative for hearing loss, congestion, tinnitus and ear discharge.  dentist q35mEyes: Negative for visual disturbance (see optho q1y -- vision corrected to 20/20 with glasses).  Respiratory: Negative for cough, chest tightness and shortness of breath.   Cardiovascular: Negative for chest pain, palpitations and leg swelling.  Gastrointestinal: Negative for abdominal pain, diarrhea, constipation and abdominal distention.  Genitourinary: Negative for urgency, frequency, decreased urine volume and difficulty urinating.  Musculoskeletal: Negative for back pain, arthralgias and gait problem.  Skin: Negative for color change, pallor and rash.  Neurological: Negative for dizziness, light-headedness, numbness and headaches.  Hematological: Negative for adenopathy. Does not  bruise/bleed easily.  Psychiatric/Behavioral: Negative for suicidal ideas, confusion, sleep disturbance, self-injury, dysphoric mood, decreased concentration and agitation.       Objective:    BP 108/80 (BP Location: Right Arm, Patient Position: Sitting, Cuff Size: Normal)   Pulse 62   Temp (!) 97.5 F (36.4 C) (Temporal)   Resp 18   Ht _0  (1.702 m)   Wt 187 lb 9.6 oz (85.1 kg)   LMP 05/08/2019   SpO2 98%   BMI 29.38 kg/m  General appearance: alert, cooperative, appears stated age and no distress Head: Normocephalic, without obvious abnormality, atraumatic Eyes: conjunctivae/corneas clear. PERRL, EOM's intact. Fundi benign. Ears: normal TM's and external ear canals both ears Neck: no adenopathy, no carotid bruit, no JVD, supple, symmetrical, trachea midline and thyroid not enlarged, symmetric, no tenderness/mass/nodules Back: symmetric, no curvature. ROM normal. No CVA tenderness. Lungs: clear to auscultation bilaterally Breasts: normal appearance, no masses or tenderness Heart: regular rate and rhythm, S1, S2 normal, no murmur, click, rub or gallop Abdomen: soft, non-tender; bowel sounds  normal; no masses,  no organomegaly Pelvic: cervix normal in appearance, external genitalia normal, no adnexal masses or tenderness, no cervical motion tenderness, rectovaginal septum normal, uterus normal size, shape, and consistency, vagina normal without discharge and pap done -- cervical polyp seen -- unable to remove  Extremities: extremities normal, atraumatic, no cyanosis or edema Pulses: 2+ and symmetric Skin: Skin color, texture, turgor normal. No rashes or lesions Lymph nodes: Cervical, supraclavicular, and axillary nodes normal. Neurologic: Alert and oriented X 3, normal strength and tone. Normal symmetric reflexes. Normal coordination and gait    Assessment:    Healthy female exam.      Plan:    ghm utd Check labs  See After Visit Summary for Counseling Recommendations     1. Generalized anxiety disorder Stable  con't meds  - ALPRAZolam (XANAX) 0.25 MG tablet; TAKE 1 TABLET(0.25 MG) BY MOUTH THREE TIMES DAILY AS NEEDED  Dispense: 30 tablet; Refill: 1 - venlafaxine XR (EFFEXOR XR) 75 MG 24 hr capsule; Take 1 capsule (75 mg total) by mouth daily with breakfast.  Dispense: 30 capsule; Refill: 1  2. Dyslipidemia (high LDL; low HDL) Tolerating statin, encouraged heart healthy diet, avoid trans fats, minimize simple carbs and saturated fats. Increase exercise as tolerated - rosuvastatin (CRESTOR) 10 MG tablet; Take 1 tablet (10 mg total) by mouth daily.  Dispense: 90 tablet; Refill: 1 - Lipid panel - Comprehensive metabolic panel  3. Uncontrolled type 2 diabetes mellitus with hyperglycemia ([place]) hgba1c to be checked , minimize simple carbs. Increase exercise as tolerated. Continue current meds - metFORMIN (GLUCOPHAGE) 500 MG tablet; Take 1 tablet (500 mg total) by mouth 2 (two) times daily with a meal.  Dispense: 180 tablet; Refill: 3 - Lipid panel - Comprehensive metabolic panel - Hemoglobin A1c  4. Preventative health care See above  - Lipid panel - CBC with Differential/Platelet - TSH - Comprehensive metabolic panel - Cytology - PAP( [place])  5. Need for hepatitis C screening test   - Hepatitis C antibody  6. Cervical polyp   - US Pelvic Complete With Transvaginal; Future - Ambulatory referral to Obstetrics / Gynecology - Cytology - PAP( [place])

## 2019-09-25 NOTE — Patient Instructions (Signed)

## 2019-09-29 LAB — CYTOLOGY - PAP: Diagnosis: NEGATIVE

## 2019-09-29 LAB — HEPATITIS C ANTIBODY
Hepatitis C Ab: NONREACTIVE
SIGNAL TO CUT-OFF: 0.01 (ref <1.00)

## 2019-09-30 ENCOUNTER — Encounter: Payer: Self-pay | Admitting: Family Medicine

## 2019-09-30 DIAGNOSIS — E785 Hyperlipidemia, unspecified: Secondary | ICD-10-CM

## 2019-09-30 MED ORDER — ROSUVASTATIN CALCIUM 20 MG PO TABS: 20.0000 mg | ORAL_TABLET | Freq: Every day | ORAL | 1 refills | Status: DC

## 2019-10-01 ENCOUNTER — Encounter: Payer: Self-pay | Admitting: Family Medicine

## 2019-10-01 NOTE — Telephone Encounter (Signed)
Do I need to place a new referral or can we make a switch?

## 2019-10-02 ENCOUNTER — Ambulatory Visit (HOSPITAL_COMMUNITY)
Admission: RE | Admit: 2019-10-02 | Discharge: 2019-10-02 | Disposition: A | Discharge status: Home / Self Care | Payer: BC Managed Care – PPO | Source: Ambulatory Visit | Attending: Family Medicine | Admitting: Family Medicine

## 2019-10-02 DIAGNOSIS — N841 Polyp of cervix uteri: Secondary | ICD-10-CM | POA: Insufficient documentation

## 2019-10-20 ENCOUNTER — Ambulatory Visit (INDEPENDENT_AMBULATORY_CARE_PROVIDER_SITE_OTHER): Payer: BC Managed Care – PPO

## 2019-10-20 ENCOUNTER — Other Ambulatory Visit: Payer: Self-pay

## 2019-10-20 ENCOUNTER — Ambulatory Visit: Payer: BC Managed Care – PPO | Admitting: Podiatry

## 2019-10-20 ENCOUNTER — Encounter: Payer: Self-pay | Admitting: Podiatry

## 2019-10-20 DIAGNOSIS — M722 Plantar fascial fibromatosis: Secondary | ICD-10-CM

## 2019-10-20 DIAGNOSIS — M2011 Hallux valgus (acquired), right foot: Secondary | ICD-10-CM | POA: Diagnosis not present

## 2019-10-21 NOTE — Progress Notes (Signed)
Subjective:  Patient ID: Kelli Evans, female    DOB: [birth year],  MRN: [id number] HPI Chief Complaint  Patient presents with   Foot Pain    Arch bilateral - knots x years, increased in size, now bothersome, tightness   Foot Pain    1st MPJ right - bunion deformity x years, becoming sore, redness   Foot Pain    5th MPJ and lateral side right - burning sensations   Callouses    Hallux left - slightly callused medial side   Foot Pain    Plantar bilateral - cramping at night, intermittent episodes   Diabetes    Last a1c was 6.4   New Patient (Initial Visit)    54 y.o. female presents with the above complaint.   ROS: Denies fever chills nausea vomiting muscle aches pains calf pain back pain chest pain shortness of breath.  Past Medical History:  Diagnosis Date   Allergy    Anemia    past hx of anemia 2019   Anxiety    Diabetes mellitus without complication ([facility])    GERD (gastroesophageal reflux disease)    Hyperlipidemia    no meds   Hypothyroidism    past hx    Past Surgical History:  Procedure Laterality Date   WISDOM TOOTH EXTRACTION      Current Outpatient Medications:    ALPRAZolam (XANAX) 0.25 MG tablet, TAKE 1 TABLET(0.25 MG) BY MOUTH THREE TIMES DAILY AS NEEDED, Disp: 30 tablet, Rfl: 1   Blood Glucose Monitoring Suppl (ONETOUCH VERIO) w/Device KIT, Use to check blood sugar once a day.  Dx Code: E11.9, Disp: 1 kit, Rfl: 0   glucose blood (ONETOUCH VERIO) test strip, Use to check blood sugar once a day.  Dx Code: E11.9, Disp: 100 each, Rfl: 1   metFORMIN (GLUCOPHAGE) 500 MG tablet, Take 1 tablet (500 mg total) by mouth 2 (two) times daily with a meal., Disp: 180 tablet, Rfl: 3   Multiple Vitamins-Minerals (ONE-A-DAY 50 PLUS PO), Take by mouth daily., Disp: , Rfl:    omeprazole (PRILOSEC) 20 MG capsule, TAKE 1 CAPSULE BY MOUTH EVERY DAY, Disp: 90 capsule, Rfl: 1   OneTouch Delica Lancets 99J MISC, Use to check blood sugar once a day.  Dx  Code: E11.9, Disp: 100 each, Rfl: 1   rosuvastatin (CRESTOR) 20 MG tablet, Take 1 tablet (20 mg total) by mouth daily., Disp: 90 tablet, Rfl: 1   venlafaxine XR (EFFEXOR XR) 75 MG 24 hr capsule, Take 1 capsule (75 mg total) by mouth daily with breakfast., Disp: 30 capsule, Rfl: 1  Current Facility-Administered Medications:    betamethasone acetate-betamethasone sodium phosphate (CELESTONE) injection 3 mg, 3 mg, Intramuscular, Once, Evans, Brent M, DPM   betamethasone acetate-betamethasone sodium phosphate (CELESTONE) injection 3 mg, 3 mg, Intramuscular, Once, Evans, Brent M, DPM  No Known Allergies Review of Systems Objective:  There were no vitals filed for this visit.  General: Well developed, nourished, in no acute distress, alert and oriented x3   Dermatological: Skin is warm, dry and supple bilateral. Nails x 10 are well maintained; remaining integument appears unremarkable at this time. There are no open sores, no preulcerative lesions, no rash or signs of infection present.  Fibrous nodules nonpulsatile in nature medial band of the plantar fascia bilaterally consistent with plantar fasciitis/plantar fibromatosis.  Vascular: Dorsalis Pedis artery and Posterior Tibial artery pedal pulses are 2/4 bilateral with immedate capillary fill time. Pedal hair growth present. No varicosities and no lower extremity edema present  bilateral.   Neruologic: Grossly intact via light touch bilateral. Vibratory intact via tuning fork bilateral. Protective threshold with Semmes Wienstein monofilament intact to all pedal sites bilateral. Patellar and Achilles deep tendon reflexes 2+ bilateral. No Babinski or clonus noted bilateral.   Musculoskeletal: No gross boney pedal deformities bilateral. No pain, crepitus, or limitation noted with foot and ankle range of motion bilateral. Muscular strength 5/5 in all groups tested bilateral.  Gait: Unassisted, Nonantalgic.    Radiographs:  Radiographs taken  today demonstrate an osseously mature individual no significant acute findings.  Some thickening of the soft tissue along the plantar medial arch consistent with swelling or plantar fibromas.  No calcifications are noted.  Assessment & Plan:   Assessment: Plantar fibromatosis bilateral.   Plan: Injected the bilateral plantar fibromas today we will follow-up with her in about 6 weeks to make sure they are doing well.  Did discuss the need for possible orthotics.     Jw Covin T. Bryan, [place]

## 2019-10-26 ENCOUNTER — Encounter: Payer: Self-pay | Admitting: Obstetrics and Gynecology

## 2019-10-26 ENCOUNTER — Ambulatory Visit: Payer: BC Managed Care – PPO | Admitting: Obstetrics and Gynecology

## 2019-10-26 ENCOUNTER — Other Ambulatory Visit: Payer: Self-pay

## 2019-10-26 ENCOUNTER — Other Ambulatory Visit (HOSPITAL_COMMUNITY)
Admission: RE | Admit: 2019-10-26 | Discharge: 2019-10-26 | Disposition: A | Discharge status: Home / Self Care | Payer: BC Managed Care – PPO | Source: Ambulatory Visit | Attending: Obstetrics and Gynecology | Admitting: Obstetrics and Gynecology

## 2019-10-26 DIAGNOSIS — N841 Polyp of cervix uteri: Secondary | ICD-10-CM | POA: Insufficient documentation

## 2019-10-26 NOTE — Addendum Note (Signed)
Addended by: Huey Bienenstock on: 10/26/2019 04:52 PM   Modules accepted: Orders

## 2019-10-26 NOTE — Progress Notes (Signed)
54 year old seen in [facility] office for evaluation of possible cervical polyp.Very small Polypoid lesion noted in the ectocervix with narrow stalk.  Recent pap 09/2019 reviewed Benign findings noted.  Per pt and chart review, the lesion was noted during annual wellness exam.  Pt denied and pain or postmenopausal bleeding.  CLINICAL DATA:  Cervical polyp; patient states postmenopausal with LMP 05/08/2019   EXAM: TRANSABDOMINAL AND TRANSVAGINAL ULTRASOUND OF PELVIS   TECHNIQUE: Both transabdominal and transvaginal ultrasound examinations of the pelvis were performed. Transabdominal technique was performed for global imaging of the pelvis including uterus, ovaries, adnexal regions, and pelvic cul-de-sac. It was necessary to proceed with endovaginal exam following the transabdominal exam to visualize the uterus, endometrium, and ovaries.   COMPARISON:  None   FINDINGS: Uterus   Measurements: 6.0 x 2.9 x 3.2 cm = volume: 29 mL. Anteverted. Normal morphology with probable small submucosal leiomyoma at anterior uterus 12 x 12 x 11 mm.   Endometrium   Thickness: 5 mm. Small amount of endocervical fluid. Several tiny brightly hyperechoic foci are identified at the endocervical canal, 2 mm and 3 mm in greatest sizes, likely tiny calcifications. Nabothian cyst at cervix. No definite cervical polyp is identified.   Right ovary   Measurements: 3.0 x 1.8 x 2.3 cm = volume: 6.5 mL. Normal morphology without mass   Left ovary   Measurements: 3.7 x 1.2 x 2.5 cm = volume: 5.9 mL. Normal morphology without mass   Other findings   No free pelvic fluid.  No adnexal masses.   IMPRESSION: Small amount of endocervical fluid and several tiny calcifications at the cervical canal.   No definite cervical polyp identified.   Probable 12 mm submucosal leiomyoma at anterior mid uterus     CERVICAL POLYPECTOMY NOTE Risks of the biopsy including pain, bleeding, infection, inadequate specimen,  and need for additional procedures were discussed. The patient stated understanding and agreed to undergo procedure today. Consent was signed,  time out performed.  The patient's cervix was prepped with Betadine.  Straight and curved kelly clamps were used to grasp the polypoid lesion and the polypoid lesions was removed by twisting it off its base. The lesion was removed piecemeal.   Tissue sent to pathology for analysis.  Small bleeding was noted and hemostasis was achieved using monsel's solution.  The patient tolerated the procedure well. Post-procedure instructions were given to the patient. The patient is to call with heavy bleeding, fever greater than 100.4, foul smelling vaginal discharge or other concerns. The patient will be return to clinic in two weeks for discussion of results or will receive path results through [facility].

## 2019-10-28 LAB — SURGICAL PATHOLOGY

## 2019-11-13 ENCOUNTER — Other Ambulatory Visit: Payer: BC Managed Care – PPO

## 2019-11-13 ENCOUNTER — Other Ambulatory Visit: Payer: Self-pay | Admitting: Sleep Medicine

## 2019-11-13 DIAGNOSIS — Z20822 Contact with and (suspected) exposure to covid-19: Secondary | ICD-10-CM

## 2019-11-15 LAB — SARS-COV-2, NAA 2 DAY TAT

## 2019-11-15 LAB — SPECIMEN STATUS REPORT

## 2019-11-15 LAB — NOVEL CORONAVIRUS, NAA: SARS-CoV-2, NAA: NOT DETECTED

## 2019-11-29 ENCOUNTER — Other Ambulatory Visit: Payer: Self-pay | Admitting: Family Medicine

## 2019-11-29 DIAGNOSIS — F411 Generalized anxiety disorder: Secondary | ICD-10-CM

## 2019-12-03 ENCOUNTER — Ambulatory Visit: Payer: BC Managed Care – PPO | Admitting: Podiatry

## 2019-12-07 ENCOUNTER — Other Ambulatory Visit: Payer: Self-pay | Admitting: Family Medicine

## 2019-12-07 ENCOUNTER — Encounter: Payer: Self-pay | Admitting: Family Medicine

## 2019-12-07 DIAGNOSIS — F411 Generalized anxiety disorder: Secondary | ICD-10-CM

## 2019-12-09 ENCOUNTER — Other Ambulatory Visit: Payer: Self-pay | Admitting: Family Medicine

## 2019-12-09 DIAGNOSIS — F418 Other specified anxiety disorders: Secondary | ICD-10-CM

## 2019-12-09 MED ORDER — VENLAFAXINE HCL ER 150 MG PO CP24: 150.0000 mg | ORAL_CAPSULE | Freq: Every day | ORAL | 1 refills | Status: DC

## 2019-12-09 NOTE — Telephone Encounter (Signed)
Rx sent 

## 2019-12-30 ENCOUNTER — Other Ambulatory Visit: Payer: Self-pay | Admitting: Family Medicine

## 2019-12-30 DIAGNOSIS — E785 Hyperlipidemia, unspecified: Secondary | ICD-10-CM

## 2020-01-23 ENCOUNTER — Ambulatory Visit: Payer: BC Managed Care – PPO | Attending: Internal Medicine

## 2020-01-23 DIAGNOSIS — Z23 Encounter for immunization: Secondary | ICD-10-CM

## 2020-01-23 NOTE — Progress Notes (Signed)
   Covid-19 Vaccination Clinic  Name:  IYANNAH BLAKE    MRN: [id number] DOB: 06-10-1965  01/23/2020  Ms. Slingerland was observed post Covid-19 immunization for 15 minutes without incident. She was provided with Vaccine Information Sheet and instruction to access the V-Safe system.   Ms. Granberry was instructed to call 911 with any severe reactions post vaccine: Marland Kitchen Difficulty breathing  . Swelling of face and throat  . A fast heartbeat  . A bad rash all over body  . Dizziness and weakness

## 2020-01-25 ENCOUNTER — Other Ambulatory Visit: Payer: Self-pay | Admitting: Family Medicine

## 2020-01-25 DIAGNOSIS — F411 Generalized anxiety disorder: Secondary | ICD-10-CM

## 2020-01-25 NOTE — Telephone Encounter (Signed)
Requesting: alprazolam 0.25mg  Contract: 01/20/2019 UDS: 01/20/2019  Last Visit: 09/25/2019 Next Visit: None scheduled Last Refill: 09/25/2019 #30 and 1RF   Please Advise

## 2020-01-27 ENCOUNTER — Other Ambulatory Visit: Payer: Self-pay

## 2020-01-27 DIAGNOSIS — K219 Gastro-esophageal reflux disease without esophagitis: Secondary | ICD-10-CM

## 2020-01-27 MED ORDER — OMEPRAZOLE 20 MG PO CPDR: 20.0000 mg | DELAYED_RELEASE_CAPSULE | Freq: Every day | ORAL | 1 refills | Status: DC

## 2020-02-01 ENCOUNTER — Encounter: Payer: Self-pay | Admitting: Family Medicine

## 2020-02-01 DIAGNOSIS — E1165 Type 2 diabetes mellitus with hyperglycemia: Secondary | ICD-10-CM

## 2020-02-02 MED ORDER — METFORMIN HCL 500 MG PO TABS: 500.0000 mg | ORAL_TABLET | Freq: Two times a day (BID) | ORAL | 5 refills | Status: DC

## 2020-02-17 MED ORDER — METFORMIN HCL 500 MG PO TABS: 500.0000 mg | ORAL_TABLET | Freq: Two times a day (BID) | ORAL | 2 refills | Status: DC

## 2020-02-17 NOTE — Addendum Note (Signed)
Addended by: Sanda Linger on: 02/17/2020 01:55 PM   Modules accepted: Orders

## 2020-03-28 ENCOUNTER — Other Ambulatory Visit: Payer: Self-pay

## 2020-03-28 ENCOUNTER — Ambulatory Visit: Payer: BC Managed Care – PPO | Admitting: Family Medicine

## 2020-06-09 ENCOUNTER — Other Ambulatory Visit: Payer: Self-pay | Admitting: Family Medicine

## 2020-06-09 DIAGNOSIS — F418 Other specified anxiety disorders: Secondary | ICD-10-CM

## 2020-06-09 DIAGNOSIS — F411 Generalized anxiety disorder: Secondary | ICD-10-CM

## 2020-06-10 NOTE — Telephone Encounter (Signed)
Requesting: ALprazolam Contract: 01/20/2019  UDS: 01/20/2019  Last Visit: 09/25/2019 Next Visit: 09/28/20 Last Refill: 01/25/2020  Please Advise

## 2020-07-24 ENCOUNTER — Encounter: Payer: Self-pay | Admitting: Family Medicine

## 2020-09-20 ENCOUNTER — Encounter: Payer: Self-pay | Admitting: Internal Medicine

## 2020-09-21 ENCOUNTER — Encounter: Payer: Self-pay | Admitting: Family Medicine

## 2020-09-22 ENCOUNTER — Other Ambulatory Visit: Payer: Self-pay

## 2020-09-22 DIAGNOSIS — F418 Other specified anxiety disorders: Secondary | ICD-10-CM

## 2020-09-22 MED ORDER — VENLAFAXINE HCL ER 150 MG PO CP24: 150.0000 mg | ORAL_CAPSULE | Freq: Every day | ORAL | 0 refills | Status: DC

## 2020-09-22 NOTE — Telephone Encounter (Signed)
Are you okay with refilling meds until patient can be seen at her new Drs? May need a visit?

## 2020-09-28 ENCOUNTER — Encounter: Payer: BC Managed Care – PPO | Admitting: Family Medicine

## 2020-09-29 ENCOUNTER — Encounter: Payer: Self-pay | Admitting: Family Medicine

## 2020-09-29 ENCOUNTER — Ambulatory Visit: Payer: BC Managed Care – PPO | Admitting: Family Medicine

## 2020-09-29 ENCOUNTER — Other Ambulatory Visit: Payer: Self-pay

## 2020-09-29 VITALS — BP 102/76 | HR 84 | Temp 98.8°F | Resp 18 | Ht 67.0 in | Wt 202.6 lb

## 2020-09-29 DIAGNOSIS — E1165 Type 2 diabetes mellitus with hyperglycemia: Secondary | ICD-10-CM | POA: Diagnosis not present

## 2020-09-29 DIAGNOSIS — Z23 Encounter for immunization: Secondary | ICD-10-CM | POA: Diagnosis not present

## 2020-09-29 DIAGNOSIS — F418 Other specified anxiety disorders: Secondary | ICD-10-CM | POA: Diagnosis not present

## 2020-09-29 DIAGNOSIS — E785 Hyperlipidemia, unspecified: Secondary | ICD-10-CM | POA: Diagnosis not present

## 2020-09-29 DIAGNOSIS — F411 Generalized anxiety disorder: Secondary | ICD-10-CM | POA: Diagnosis not present

## 2020-09-29 DIAGNOSIS — E1169 Type 2 diabetes mellitus with other specified complication: Secondary | ICD-10-CM | POA: Diagnosis not present

## 2020-09-29 LAB — COMPREHENSIVE METABOLIC PANEL
ALT: 23 U/L (ref 0–35)
AST: 19 U/L (ref 0–37)
Albumin: 4.9 g/dL (ref 3.5–5.2)
Alkaline Phosphatase: 70 U/L (ref 39–117)
BUN: 12 mg/dL (ref 6–23)
CO2: 29 mEq/L (ref 19–32)
Calcium: 10.3 mg/dL (ref 8.4–10.5)
Chloride: 100 mEq/L (ref 96–112)
Creatinine, Ser: 0.77 mg/dL (ref 0.40–1.20)
GFR: 87.32 mL/min (ref >60.00)
Glucose, Bld: 125 mg/dL — ABNORMAL HIGH (ref 70–99)
Potassium: 4.4 mEq/L (ref 3.5–5.1)
Sodium: 138 mEq/L (ref 135–145)
Total Bilirubin: 0.7 mg/dL (ref 0.2–1.2)
Total Protein: 7.4 g/dL (ref 6.0–8.3)

## 2020-09-29 LAB — MICROALBUMIN / CREATININE URINE RATIO
Creatinine,U: 282 mg/dL
Microalb Creat Ratio: 1.7 mg/g (ref 0.0–30.0)
Microalb, Ur: 4.7 mg/dL — ABNORMAL HIGH (ref 0.0–1.9)

## 2020-09-29 LAB — HEMOGLOBIN A1C: Hgb A1c MFr Bld: 7.4 % — ABNORMAL HIGH (ref 4.6–6.5)

## 2020-09-29 LAB — LIPID PANEL
Cholesterol: 166 mg/dL (ref 0–200)
HDL: 59 mg/dL (ref >39.00)
LDL Cholesterol: 87 mg/dL (ref 0–99)
NonHDL: 106.89
Total CHOL/HDL Ratio: 3
Triglycerides: 100 mg/dL (ref 0.0–149.0)
VLDL: 20 mg/dL (ref 0.0–40.0)

## 2020-09-29 MED ORDER — SPIRONOLACTONE 25 MG PO TABS: ORAL_TABLET | ORAL | 1 refills | Status: DC

## 2020-09-29 MED ORDER — ALPRAZOLAM 0.25 MG PO TABS: ORAL_TABLET | ORAL | 1 refills | Status: DC

## 2020-09-29 MED ORDER — VENLAFAXINE HCL ER 150 MG PO CP24: 150.0000 mg | ORAL_CAPSULE | Freq: Every day | ORAL | 3 refills | Status: DC

## 2020-09-29 NOTE — Progress Notes (Signed)
Subjective:   By signing my name below, I, Shehryar Baig, attest that this documentation has been prepared under the direction and in the presence of Dr. Roma Schanz, DO. 09/29/2020    Patient ID: Kelli Evans, female    DOB: 1965-11-07, 55 y.o.   MRN: [id number]  Chief Complaint  Patient presents with   Hyperlipidemia   Depression   Follow-up    HPI Patient is in today for a office visit. ==f/u dm, chol and depression.  No complaints   She reports she does not regularly measure her sugar level at home. Otherwise she is doing well during this visit.  She is requesting a refill for 0.25 mg alprazolam 3x daily PRN, and 25 mg aldactone 2x daily PO. She denies having any abdominal pain, joint pain, or new moles at this time. She is planning to make an appointment for her mammogram. She is due for a tetanus vaccine and is willing to get it during this visit. She is not interested in getting an HIV screening at this time. She is willing to get a pneumonia vaccine because of her past history of diabetes. She has no recent change in her family history. She has had no recent procedures. She is UTD on vision care and has an upcoming appointment next month. She is UTD on dental care.  Past Medical History:  Diagnosis Date   Allergy    Anemia    past hx of anemia 2019   Anxiety    Diabetes mellitus without complication (HCC)    GERD (gastroesophageal reflux disease)    Hyperlipidemia    no meds   Hypothyroidism    past hx     Past Surgical History:  Procedure Laterality Date   WISDOM TOOTH EXTRACTION      Family History  Problem Relation Age of Onset   Breast cancer Mother    Cancer Mother 50       breast   Colon polyps Mother    Hypertension Father    Heart disease Father        chf, pacemaker   Cancer Father        brain   Hyperlipidemia Father    Hypothyroidism Father    Brain cancer Father    Colon polyps Father    Cancer Cousin 47       breast    Other  Sister        tamoxifen x 3 yrs for a pre cancereous  issue   Breast cancer Maternal Grandfather    Colon cancer Neg Hx    Esophageal cancer Neg Hx    Rectal cancer Neg Hx    Stomach cancer Neg Hx     Social History   Socioeconomic History   Marital status: Single    Spouse name: Not on file   Number of children: Not on file   Years of education: Not on file   Highest education level: Not on file  Occupational History   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    Employer: [employer]: [employer]  Tobacco Use   Smoking status: Never   Smokeless tobacco: Never  Substance and Sexual Activity   Alcohol use: Yes    Comment: once a month    Drug use: No   Sexual activity: Never  Other Topics Concern   Not on file  Social History Narrative   Exercise-- walking the dog    Social Determinants of Health  Financial Resource Strain: Not on file  Food Insecurity: Not on file  Transportation Needs: Not on file  Physical Activity: Not on file  Stress: Not on file  Social Connections: Not on file  Intimate Partner Violence: Not on file    Outpatient Medications Prior to Visit  Medication Sig Dispense Refill   Blood Glucose Monitoring Suppl (ONETOUCH VERIO) w/Device KIT Use to check blood sugar once a day.  Dx Code: E11.9 1 kit 0   glucose blood (ONETOUCH VERIO) test strip Use to check blood sugar once a day.  Dx Code: E11.9 100 each 1   metFORMIN (GLUCOPHAGE) 500 MG tablet Take 1 tablet (500 mg total) by mouth 2 (two) times daily with a meal. 60 tablet 2   Multiple Vitamins-Minerals (ONE-A-DAY 50 PLUS PO) Take by mouth daily.     omeprazole (PRILOSEC) 20 MG capsule Take 1 capsule (20 mg total) by mouth daily. 90 capsule 1   OneTouch Delica Lancets 92J MISC Use to check blood sugar once a day.  Dx Code: E11.9 100 each 1   rosuvastatin (CRESTOR) 20 MG tablet Take 1 tablet (20 mg total) by mouth daily. 30 tablet 0   ALPRAZolam (XANAX) 0.25 MG tablet TAKE 1  TABLET(0.25 MG) BY MOUTH THREE TIMES DAILY AS NEEDED 30 tablet 1   spironolactone (ALDACTONE) 25 MG tablet TAKE 1 TABLET(25 MG) BY MOUTH TWICE DAILY 60 tablet 1   venlafaxine XR (EFFEXOR-XR) 150 MG 24 hr capsule Take 1 capsule (150 mg total) by mouth daily with breakfast. 30 capsule 0   Facility-Administered Medications Prior to Visit  Medication Dose Route Frequency Provider Last Rate Last Admin   betamethasone acetate-betamethasone sodium phosphate (CELESTONE) injection 3 mg  3 mg Intramuscular Once Daylene Katayama M, DPM       betamethasone acetate-betamethasone sodium phosphate (CELESTONE) injection 3 mg  3 mg Intramuscular Once Daylene Katayama M, DPM        No Known Allergies  Review of Systems  Constitutional:  Negative for fever and malaise/fatigue.  HENT:  Negative for congestion.   Eyes:  Negative for blurred vision.  Respiratory:  Negative for shortness of breath.   Cardiovascular:  Negative for chest pain, palpitations and leg swelling.  Gastrointestinal:  Negative for abdominal pain, blood in stool and nausea.  Genitourinary:  Negative for dysuria and frequency.  Musculoskeletal:  Negative for falls and joint pain.  Skin:  Negative for rash.       (-)New skin moles  Neurological:  Negative for dizziness, loss of consciousness and headaches.  Endo/Heme/Allergies:  Negative for environmental allergies.  Psychiatric/Behavioral:  Negative for depression. The patient is not nervous/anxious.       Objective:    Physical Exam Vitals and nursing note reviewed.  Constitutional:      General: She is not in acute distress.    Appearance: Normal appearance. She is not ill-appearing.  HENT:     Head: Normocephalic and atraumatic.     Right Ear: External ear normal.     Left Ear: External ear normal.  Eyes:     Extraocular Movements: Extraocular movements intact.     Pupils: Pupils are equal, round, and reactive to light.  Cardiovascular:     Rate and Rhythm: Normal rate and  regular rhythm.     Pulses: Normal pulses.     Heart sounds: Normal heart sounds. No murmur heard.   No gallop.  Pulmonary:     Effort: Pulmonary effort is normal. No respiratory distress.  Breath sounds: Normal breath sounds. No wheezing, rhonchi or rales.  Feet:     Comments: Diabetic Foot Exam - Simple   No data filed    Skin:    General: Skin is warm and dry.  Neurological:     Mental Status: She is alert and oriented to person, place, and time.  Psychiatric:        Mood and Affect: Mood normal.        Behavior: Behavior normal.        Thought Content: Thought content normal.        Judgment: Judgment normal.   Simple Foot Form Diabetic Foot exam was performed with the following findings: Yes 09/29/2020 12:25 PM  Visual Inspection No deformities, no ulcerations, no other skin breakdown bilaterally: Yes Sensation Testing Intact to touch and monofilament testing bilaterally: Yes Pulse Check Posterior Tibialis and Dorsalis pulse intact bilaterally: Yes Comments      BP 102/76 (BP Location: Right Arm, Patient Position: Sitting, Cuff Size: Normal)   Pulse 84   Temp 98.8 F (37.1 C) (Oral)   Resp 18   Ht 5' 7"  (1.702 m)   Wt 202 lb 9.6 oz (91.9 kg)   SpO2 95%   BMI 31.73 kg/m  Wt Readings from Last 3 Encounters:  09/29/20 202 lb 9.6 oz (91.9 kg)  10/26/19 195 lb (88.5 kg)  09/25/19 187 lb 9.6 oz (85.1 kg)    Diabetic Foot Exam - Simple   Simple Foot Form Diabetic Foot exam was performed with the following findings: Yes 09/29/2020 12:25 PM  Visual Inspection No deformities, no ulcerations, no other skin breakdown bilaterally: Yes Sensation Testing Intact to touch and monofilament testing bilaterally: Yes Pulse Check Posterior Tibialis and Dorsalis pulse intact bilaterally: Yes Comments    Lab Results  Component Value Date   WBC 5.8 09/25/2019   HGB 13.3 09/25/2019   HCT 39.5 09/25/2019   PLT 221.0 09/25/2019   GLUCOSE 90 09/25/2019   CHOL 192  09/25/2019   TRIG 94.0 09/25/2019   HDL 56.20 09/25/2019   LDLDIRECT 151.6 10/31/2012   LDLCALC 117 (H) 09/25/2019   ALT 18 09/25/2019   AST 16 09/25/2019   NA 140 09/25/2019   K 4.5 09/25/2019   CL 101 09/25/2019   CREATININE 0.74 09/25/2019   BUN 13 09/25/2019   CO2 31 09/25/2019   TSH 2.26 09/25/2019   HGBA1C 6.4 09/25/2019   MICROALBUR 0.9 06/10/2019    Lab Results  Component Value Date   TSH 2.26 09/25/2019   Lab Results  Component Value Date   WBC 5.8 09/25/2019   HGB 13.3 09/25/2019   HCT 39.5 09/25/2019   MCV 84.6 09/25/2019   PLT 221.0 09/25/2019   Lab Results  Component Value Date   NA 140 09/25/2019   K 4.5 09/25/2019   CO2 31 09/25/2019   GLUCOSE 90 09/25/2019   BUN 13 09/25/2019   CREATININE 0.74 09/25/2019   BILITOT 0.7 09/25/2019   ALKPHOS 66 09/25/2019   AST 16 09/25/2019   ALT 18 09/25/2019   PROT 7.0 09/25/2019   ALBUMIN 4.6 09/25/2019   CALCIUM 10.6 (H) 09/25/2019   GFR 81.90 09/25/2019   Lab Results  Component Value Date   CHOL 192 09/25/2019   Lab Results  Component Value Date   HDL 56.20 09/25/2019   Lab Results  Component Value Date   LDLCALC 117 (H) 09/25/2019   Lab Results  Component Value Date   TRIG 94.0 09/25/2019  Lab Results  Component Value Date   CHOLHDL 3 09/25/2019   Lab Results  Component Value Date   HGBA1C 6.4 09/25/2019       Assessment & Plan:   Problem List Items Addressed This Visit       Unprioritized   Depression with anxiety    Stable con't meds        Relevant Medications   ALPRAZolam (XANAX) 0.25 MG tablet   venlafaxine XR (EFFEXOR-XR) 150 MG 24 hr capsule   Hyperlipidemia    Tolerating statin, encouraged heart healthy diet, avoid trans fats, minimize simple carbs and saturated fats. Increase exercise as tolerated       Relevant Medications   spironolactone (ALDACTONE) 25 MG tablet   Uncontrolled type 2 diabetes mellitus with hyperglycemia ([place]) - Primary    hgba1c to be  done, minimize simple carbs. Increase exercise as tolerated. Continue current meds        Relevant Orders   Hemoglobin A1c   Comprehensive metabolic panel   Microalbumin / creatinine urine ratio   Other Visit Diagnoses     Generalized anxiety disorder       Relevant Medications   ALPRAZolam (XANAX) 0.25 MG tablet   venlafaxine XR (EFFEXOR-XR) 150 MG 24 hr capsule   Hyperlipidemia associated with type 2 diabetes mellitus (HCC)       Relevant Orders   Lipid panel   Comprehensive metabolic panel   Microalbumin / creatinine urine ratio   Need for tetanus booster       Relevant Orders   Tdap vaccine greater than or equal to 7yo IM (Completed)   Need for pneumococcal vaccination       Relevant Orders   Pneumococcal polysaccharide vaccine 23-valent greater than or equal to 2yo subcutaneous/IM (Completed)        Meds ordered this encounter  Medications   ALPRAZolam (XANAX) 0.25 MG tablet    Sig: 1 po qd prn    Dispense:  30 tablet    Refill:  1   spironolactone (ALDACTONE) 25 MG tablet    Sig: TAKE 1 TABLET(25 MG) BY MOUTH TWICE DAILY    Dispense:  180 tablet    Refill:  1   venlafaxine XR (EFFEXOR-XR) 150 MG 24 hr capsule    Sig: Take 1 capsule (150 mg total) by mouth daily with breakfast.    Dispense:  90 capsule    Refill:  3    I, Dr. Roma Schanz, DO, personally preformed the services described in this documentation.  All medical record entries made by the scribe were at my direction and in my presence.  I have reviewed the chart and discharge instructions (if applicable) and agree that the record reflects my personal performance and is accurate and complete. 09/29/2020   I,Shehryar Baig,acting as a scribe for Kelli Held, DO.,have documented all relevant documentation on the behalf of Kelli Held, DO,as directed by  Kelli Held, DO while in the presence of Kelli Held, DO.   Kelli Held, DO

## 2020-09-29 NOTE — Assessment & Plan Note (Signed)
Stable con't meds 

## 2020-09-29 NOTE — Assessment & Plan Note (Signed)
Tolerating statin, encouraged heart healthy diet, avoid trans fats, minimize simple carbs and saturated fats. Increase exercise as tolerated 

## 2020-09-29 NOTE — Patient Instructions (Signed)
Preventive Care 55-55 Years Old, Female Preventive care refers to lifestyle choices and visits with your health care provider that can promote health and wellness. This includes: A yearly physical exam. This is also called an annual wellness visit. Regular dental and eye exams. Immunizations. Screening for certain conditions. Healthy lifestyle choices, such as: Eating a healthy diet. Getting regular exercise. Not using drugs or products that contain nicotine and tobacco. Limiting alcohol use. What can I expect for my preventive care visit? Physical exam Your health care provider will check your: Height and weight. These may be used to calculate your BMI (body mass index). BMI is a measurement that tells if you are at a healthy weight. Heart rate and blood pressure. Body temperature. Skin for abnormal spots. Counseling Your health care provider may ask you questions about your: Past medical problems. Family's medical history. Alcohol, tobacco, and drug use. Emotional well-being. Home life and relationship well-being. Sexual activity. Diet, exercise, and sleep habits. Work and work Statistician. Access to firearms. Method of birth control. Menstrual cycle. Pregnancy history. What immunizations do I need?  Vaccines are usually given at various ages, according to a schedule. Your health care provider will recommend vaccines for you based on your age, medicalhistory, and lifestyle or other factors, such as travel or where you work. What tests do I need? Blood tests Lipid and cholesterol levels. These may be checked every 5 years, or more often if you are over 55 years old. Hepatitis C test. Hepatitis B test. Screening Lung cancer screening. You may have this screening every year starting at age 55 if you have a 30-pack-year history of smoking and currently smoke or have quit within the past 15 years. Colorectal cancer screening. All adults should have this screening starting at  age 55 and continuing until age 3. Your health care provider may recommend screening at age 55 if you are at increased risk. You will have tests every 1-10 years, depending on your results and the type of screening test. Diabetes screening. This is done by checking your blood sugar (glucose) after you have not eaten for a while (fasting). You may have this done every 1-3 years. Mammogram. This may be done every 1-2 years. Talk with your health care provider about when you should start having regular mammograms. This may depend on whether you have a family history of breast cancer. BRCA-related cancer screening. This may be done if you have a family history of breast, ovarian, tubal, or peritoneal cancers. Pelvic exam and Pap test. This may be done every 3 years starting at age 55. Starting at age 55, this may be done every 5 years if you have a Pap test in combination with an HPV test. Other tests STD (sexually transmitted disease) testing, if you are at risk. Bone density scan. This is done to screen for osteoporosis. You may have this scan if you are at high risk for osteoporosis. Talk with your health care provider about your test results, treatment options,and if necessary, the need for more tests. Follow these instructions at home: Eating and drinking  Eat a diet that includes fresh fruits and vegetables, whole grains, lean protein, and low-fat dairy products. Take vitamin and mineral supplements as recommended by your health care provider. Do not drink alcohol if: Your health care provider tells you not to drink. You are pregnant, may be pregnant, or are planning to become pregnant. If you drink alcohol: Limit how much you have to 0-1 drink a day. Be aware  of how much alcohol is in your drink. In the [place] one drink equals one 12 oz bottle of beer (355 mL), one 5 oz glass of wine (148 mL), or one 1 oz glass of hard liquor (44 mL).  Lifestyle Take daily care of your teeth and  gums. Brush your teeth every morning and night with fluoride toothpaste. Floss one time each day. Stay active. Exercise for at least 30 minutes 5 or more days each week. Do not use any products that contain nicotine or tobacco, such as cigarettes, e-cigarettes, and chewing tobacco. If you need help quitting, ask your health care provider. Do not use drugs. If you are sexually active, practice safe sex. Use a condom or other form of protection to prevent STIs (sexually transmitted infections). If you do not wish to become pregnant, use a form of birth control. If you plan to become pregnant, see your health care provider for a prepregnancy visit. If told by your health care provider, take low-dose aspirin daily starting at age 55. Find healthy ways to cope with stress, such as: Meditation, yoga, or listening to music. Journaling. Talking to a trusted person. Spending time with friends and family. Safety Always wear your seat belt while driving or riding in a vehicle. Do not drive: If you have been drinking alcohol. Do not ride with someone who has been drinking. When you are tired or distracted. While texting. Wear a helmet and other protective equipment during sports activities. If you have firearms in your house, make sure you follow all gun safety procedures. What's next? Visit your health care provider once a year for an annual wellness visit. Ask your health care provider how often you should have your eyes and teeth checked. Stay up to date on all vaccines. This information is not intended to replace advice given to you by your health care provider. Make sure you discuss any questions you have with your healthcare provider. Document Revised: 12/15/2019 Document Reviewed: 11/21/2017 Elsevier Patient Education  2022 [employer].

## 2020-09-29 NOTE — Assessment & Plan Note (Signed)
hgba1c to be done, minimize simple carbs. Increase exercise as tolerated. Continue current meds  

## 2020-09-30 ENCOUNTER — Other Ambulatory Visit: Payer: Self-pay | Admitting: Family Medicine

## 2020-09-30 DIAGNOSIS — E1169 Type 2 diabetes mellitus with other specified complication: Secondary | ICD-10-CM

## 2020-09-30 DIAGNOSIS — E1165 Type 2 diabetes mellitus with hyperglycemia: Secondary | ICD-10-CM

## 2020-09-30 DIAGNOSIS — E785 Hyperlipidemia, unspecified: Secondary | ICD-10-CM

## 2020-09-30 MED ORDER — METFORMIN HCL 1000 MG PO TABS: 1000.0000 mg | ORAL_TABLET | Freq: Two times a day (BID) | ORAL | 3 refills | Status: DC

## 2020-09-30 NOTE — Addendum Note (Signed)
Addended by: Jeronimo Greaves on: 09/30/2020 02:35 PM   Modules accepted: Orders

## 2020-11-14 ENCOUNTER — Other Ambulatory Visit: Payer: Self-pay | Admitting: Family Medicine

## 2020-11-14 DIAGNOSIS — K219 Gastro-esophageal reflux disease without esophagitis: Secondary | ICD-10-CM

## 2021-03-24 ENCOUNTER — Encounter: Payer: Self-pay | Admitting: Family Medicine

## 2021-03-24 ENCOUNTER — Telehealth: Payer: Self-pay

## 2021-03-24 NOTE — Telephone Encounter (Signed)
FYI

## 2021-03-24 NOTE — Telephone Encounter (Signed)
Patient called in and would like to transfer to [facility] and from Dr. Carollee Herter since she lives closer to the [facility] office. Please advise.

## 2021-03-24 NOTE — Telephone Encounter (Signed)
Patient has been scheduled

## 2021-03-30 ENCOUNTER — Ambulatory Visit: Payer: BC Managed Care – PPO | Admitting: Physician Assistant

## 2021-04-19 ENCOUNTER — Ambulatory Visit (HOSPITAL_COMMUNITY)
Admission: RE | Admit: 2021-04-19 | Discharge: 2021-04-19 | Disposition: A | Discharge status: Home / Self Care | Payer: BC Managed Care – PPO | Source: Ambulatory Visit | Attending: Family Medicine | Admitting: Family Medicine

## 2021-04-19 ENCOUNTER — Encounter (HOSPITAL_COMMUNITY): Payer: Self-pay

## 2021-04-19 ENCOUNTER — Other Ambulatory Visit: Payer: Self-pay

## 2021-04-19 VITALS — BP 137/84 | HR 85 | Temp 99.3°F | Resp 18

## 2021-04-19 DIAGNOSIS — J069 Acute upper respiratory infection, unspecified: Secondary | ICD-10-CM

## 2021-04-19 DIAGNOSIS — H6691 Otitis media, unspecified, right ear: Secondary | ICD-10-CM | POA: Diagnosis not present

## 2021-04-19 MED ORDER — BENZONATATE 100 MG PO CAPS: 100.0000 mg | ORAL_CAPSULE | Freq: Three times a day (TID) | ORAL | 0 refills | Status: DC | PRN

## 2021-04-19 MED ORDER — AMOXICILLIN-POT CLAVULANATE 875-125 MG PO TABS: 1.0000 | ORAL_TABLET | Freq: Two times a day (BID) | ORAL | 0 refills | Status: AC

## 2021-04-19 NOTE — Discharge Instructions (Addendum)
Have an ear infection on the right.  Take amoxicillin/clavulanic acid 875 mg 1 tab twice daily with food for 10 days.  Take benzonatate 100 mg 1 tab 3 times daily as needed for cough.

## 2021-04-19 NOTE — ED Provider Notes (Addendum)
[place]    CSN: [id number] Arrival date & time: 04/19/21  1523      History   Chief Complaint Chief Complaint  Patient presents with   Sore Throat   Cough   Nasal Congestion    HPI Kelli Evans is a 56 y.o. female.    Sore Throat  Cough Here with a 7-day history of sore throat, cough, and congestion.  Has had some headache but that is improved.  The main thing that is bothering her now is still the sore throat.  No dyspnea.  Her ears just started bothering her today.  No fever or chills.  No vomiting or diarrhea. She does have diabetes fairly well controlled She has done 2 home COVID tests which were both negative  Past Medical History:  Diagnosis Date   Allergy    Anemia    past hx of anemia 2019   Anxiety    Diabetes mellitus without complication (HCC)    GERD (gastroesophageal reflux disease)    Hyperlipidemia    no meds   Hypothyroidism    past hx     Patient Active Problem List   Diagnosis Date Noted   Uncontrolled type 2 diabetes mellitus with hyperglycemia ([place]) 09/29/2020   Cervical polyp 10/26/2019   Chronic right shoulder pain 01/21/2019   Rash 12/19/2016   Plantar fasciitis, left 03/28/2014   Acne 11/01/2012   Obesity (BMI 30-39.9) 10/31/2012   Depression with anxiety 10/29/2011   Conjunctivitis of right eye 08/18/2011   Environmental allergies 07/10/2010   Hypothyroidism 05/28/2008   Hyperlipidemia 05/28/2008   CARPAL TUNNEL SYNDROME, BILATERAL 05/28/2008   GERD 05/28/2008    Past Surgical History:  Procedure Laterality Date   WISDOM TOOTH EXTRACTION      OB History   No obstetric history on file.      Home Medications    Prior to Admission medications   Medication Sig Start Date End Date Taking? Authorizing Provider  amoxicillin-clavulanate (AUGMENTIN) 875-125 MG tablet Take 1 tablet by mouth 2 (two) times daily for 10 days. 04/19/21 04/29/21 Yes Mesiah Manzo, Gwenlyn Perking, MD  benzonatate (TESSALON) 100 MG capsule Take 1  capsule (100 mg total) by mouth 3 (three) times daily as needed for cough. 04/19/21  Yes Barrett Henle, MD  ALPRAZolam Duanne Moron) 0.25 MG tablet 1 po qd prn 09/29/20   Carollee Herter, Alferd Apa, DO  Blood Glucose Monitoring Suppl (ONETOUCH VERIO) w/Device KIT Use to check blood sugar once a day.  Dx Code: E11.9 01/26/19   Carollee Herter, Alferd Apa, DO  glucose blood (ONETOUCH VERIO) test strip Use to check blood sugar once a day.  Dx Code: E11.9 01/26/19   Carollee Herter, Alferd Apa, DO  metFORMIN (GLUCOPHAGE) 1000 MG tablet Take 1 tablet (1,000 mg total) by mouth 2 (two) times daily with a meal. 09/30/20   Carollee Herter, Alferd Apa, DO  Multiple Vitamins-Minerals (ONE-A-DAY 50 PLUS PO) Take by mouth daily.    [provider]  omeprazole (PRILOSEC) 20 MG capsule TAKE 1 CAPSULE(20 MG) BY MOUTH DAILY 11/14/20   Carollee Herter, Alferd Apa, DO  OneTouch Delica Lancets 76O MISC Use to check blood sugar once a day.  Dx Code: E11.9 01/26/19   Carollee Herter, Alferd Apa, DO  rosuvastatin (CRESTOR) 20 MG tablet Take 1 tablet (20 mg total) by mouth daily. 06/09/20   Roma Schanz R, DO  spironolactone (ALDACTONE) 25 MG tablet TAKE 1 TABLET(25 MG) BY MOUTH TWICE DAILY 09/29/20  Carollee Herter, Yvonne R, DO  venlafaxine XR (EFFEXOR-XR) 150 MG 24 hr capsule Take 1 capsule (150 mg total) by mouth daily with breakfast. 09/29/20   Carollee Herter, Alferd Apa, DO    Family History Family History  Problem Relation Age of Onset   Breast cancer Mother    Cancer Mother 75       breast   Colon polyps Mother    Hypertension Father    Heart disease Father        chf, pacemaker   Cancer Father        brain   Hyperlipidemia Father    Hypothyroidism Father    Brain cancer Father    Colon polyps Father    Cancer Cousin 92       breast    Other Sister        tamoxifen x 3 yrs for a pre cancereous  issue   Breast cancer Maternal Grandfather    Colon cancer Neg Hx    Esophageal cancer Neg Hx    Rectal cancer Neg Hx    Stomach cancer Neg Hx      Social History Social History   Tobacco Use   Smoking status: Never   Smokeless tobacco: Never  Substance Use Topics   Alcohol use: Yes    Comment: once a month    Drug use: No     Allergies   Patient has no known allergies.   Review of Systems Review of Systems  Respiratory:  Positive for cough.     Physical Exam Triage Vital Signs ED Triage Vitals  Enc Vitals Group     BP 04/19/21 1605 137/84     Pulse Rate 04/19/21 1605 85     Resp 04/19/21 1605 18     Temp 04/19/21 1605 99.3 F (37.4 C)     Temp src --      SpO2 04/19/21 1605 98 %     Weight --      Height --      Head Circumference --      Peak Flow --      Pain Score 04/19/21 1602 6     Pain Loc --      Pain Edu? --      Excl. in [place]? --    No data found.  Updated Vital Signs BP 137/84    Pulse 85    Temp 99.3 F (37.4 C)    Resp 18    LMP 08/09/2019 (Exact Date)    SpO2 98%   Visual Acuity Right Eye Distance:   Left Eye Distance:   Bilateral Distance:    Right Eye Near:   Left Eye Near:    Bilateral Near:     Physical Exam Vitals reviewed.  Constitutional:      General: She is not in acute distress.    Appearance: She is not toxic-appearing.  HENT:     Right Ear: Ear canal normal.     Left Ear: Ear canal normal.     Ears:     Comments: Left TM is dull/gray, with altered lt reflex. Right TM is dull, with bubbles/AF levels evident, and possibly bulging    Nose: Nose normal.     Mouth/Throat:     Mouth: Mucous membranes are moist.     Pharynx: No oropharyngeal exudate or posterior oropharyngeal erythema.  Eyes:     Extraocular Movements: Extraocular movements intact.     Conjunctiva/sclera: Conjunctivae normal.  Pupils: Pupils are equal, round, and reactive to light.  Cardiovascular:     Rate and Rhythm: Normal rate and regular rhythm.     Heart sounds: No murmur heard. Pulmonary:     Effort: Pulmonary effort is normal. No respiratory distress.     Breath sounds: No wheezing,  rhonchi or rales.  Chest:     Chest wall: No tenderness.  Musculoskeletal:     Cervical back: Neck supple.  Lymphadenopathy:     Cervical: No cervical adenopathy.  Skin:    Capillary Refill: Capillary refill takes less than 2 seconds.     Coloration: Skin is not jaundiced or pale.  Neurological:     General: No focal deficit present.     Mental Status: She is alert and oriented to person, place, and time.  Psychiatric:        Behavior: Behavior normal.     UC Treatments / Results  Labs (all labs ordered are listed, but only abnormal results are displayed) Labs Reviewed - No data to display  EKG   Radiology No results found.  Procedures Procedures (including critical care time)  Medications Ordered in UC Medications - No data to display  Initial Impression / Assessment and Plan / UC Course  I have reviewed the triage vital signs and the nursing notes.  Pertinent labs & imaging results that were available during my care of the patient were reviewed by me and considered in my medical decision making (see chart for details).     Discussed doing a strep test, but since I want to give her antibiotics for her otitis media, we decided against the strep test.  Discussed that the throat symptoms may still be viral and may not improve with antibiotics.  She will see her new primary provider in about a week and can discuss further with him if she is not improving Final Clinical Impressions(s) / UC Diagnoses   Final diagnoses:  Right otitis media, unspecified otitis media type  Viral upper respiratory tract infection     Discharge Instructions      Have an ear infection on the right.  Take amoxicillin/clavulanic acid 875 mg 1 tab twice daily with food for 10 days.  Take benzonatate 100 mg 1 tab 3 times daily as needed for cough.     ED Prescriptions     Medication Sig Dispense Auth. Provider   amoxicillin-clavulanate (AUGMENTIN) 875-125 MG tablet Take 1 tablet by  mouth 2 (two) times daily for 10 days. 20 tablet Banister, Gwenlyn Perking, MD   benzonatate (TESSALON) 100 MG capsule Take 1 capsule (100 mg total) by mouth 3 (three) times daily as needed for cough. 21 capsule Barrett Henle, MD      PDMP not reviewed this encounter.   Barrett Henle, MD 04/19/21 1634    Barrett Henle, MD 04/19/21 [phone]

## 2021-04-19 NOTE — ED Triage Notes (Signed)
Pt reports for one week she has had a sore throat,slight cough and congestion.

## 2021-04-26 ENCOUNTER — Other Ambulatory Visit: Payer: Self-pay

## 2021-04-26 ENCOUNTER — Encounter: Payer: Self-pay | Admitting: Physician Assistant

## 2021-04-26 ENCOUNTER — Ambulatory Visit (INDEPENDENT_AMBULATORY_CARE_PROVIDER_SITE_OTHER): Payer: BC Managed Care – PPO | Admitting: Physician Assistant

## 2021-04-26 ENCOUNTER — Ambulatory Visit: Payer: BC Managed Care – PPO | Admitting: Physician Assistant

## 2021-04-26 VITALS — BP 120/84 | HR 88 | Temp 97.7°F | Ht 67.0 in | Wt 202.2 lb

## 2021-04-26 DIAGNOSIS — K219 Gastro-esophageal reflux disease without esophagitis: Secondary | ICD-10-CM

## 2021-04-26 DIAGNOSIS — E1169 Type 2 diabetes mellitus with other specified complication: Secondary | ICD-10-CM

## 2021-04-26 DIAGNOSIS — M25562 Pain in left knee: Secondary | ICD-10-CM | POA: Diagnosis not present

## 2021-04-26 DIAGNOSIS — E785 Hyperlipidemia, unspecified: Secondary | ICD-10-CM

## 2021-04-26 DIAGNOSIS — E119 Type 2 diabetes mellitus without complications: Secondary | ICD-10-CM

## 2021-04-26 DIAGNOSIS — E039 Hypothyroidism, unspecified: Secondary | ICD-10-CM | POA: Diagnosis not present

## 2021-04-26 DIAGNOSIS — F418 Other specified anxiety disorders: Secondary | ICD-10-CM

## 2021-04-26 LAB — COMPREHENSIVE METABOLIC PANEL
ALT: 23 U/L (ref 0–35)
AST: 17 U/L (ref 0–37)
Albumin: 4.6 g/dL (ref 3.5–5.2)
Alkaline Phosphatase: 68 U/L (ref 39–117)
BUN: 10 mg/dL (ref 6–23)
CO2: 30 mEq/L (ref 19–32)
Calcium: 10.1 mg/dL (ref 8.4–10.5)
Chloride: 101 mEq/L (ref 96–112)
Creatinine, Ser: 0.82 mg/dL (ref 0.40–1.20)
GFR: 80.65 mL/min (ref >60.00)
Glucose, Bld: 117 mg/dL — ABNORMAL HIGH (ref 70–99)
Potassium: 4.2 mEq/L (ref 3.5–5.1)
Sodium: 139 mEq/L (ref 135–145)
Total Bilirubin: 0.4 mg/dL (ref 0.2–1.2)
Total Protein: 7.2 g/dL (ref 6.0–8.3)

## 2021-04-26 LAB — CBC WITH DIFFERENTIAL/PLATELET
Basophils Absolute: 0.1 10*3/uL (ref 0.0–0.1)
Basophils Relative: 1.1 % (ref 0.0–3.0)
Eosinophils Absolute: 0.2 10*3/uL (ref 0.0–0.7)
Eosinophils Relative: 2.9 % (ref 0.0–5.0)
HCT: 38.3 % (ref 36.0–46.0)
Hemoglobin: 12.9 g/dL (ref 12.0–15.0)
Lymphocytes Relative: 19.3 % (ref 12.0–46.0)
Lymphs Abs: 1.6 10*3/uL (ref 0.7–4.0)
MCHC: 33.6 g/dL (ref 30.0–36.0)
MCV: 83.4 fl (ref 78.0–100.0)
Monocytes Absolute: 0.4 10*3/uL (ref 0.1–1.0)
Monocytes Relative: 4.8 % (ref 3.0–12.0)
Neutro Abs: 6.1 10*3/uL (ref 1.4–7.7)
Neutrophils Relative %: 71.9 % (ref 43.0–77.0)
Platelets: 256 10*3/uL (ref 150.0–400.0)
RBC: 4.6 Mil/uL (ref 3.87–5.11)
RDW: 13.5 % (ref 11.5–15.5)
WBC: 8.5 10*3/uL (ref 4.0–10.5)

## 2021-04-26 LAB — LIPID PANEL
Cholesterol: 233 mg/dL — ABNORMAL HIGH (ref 0–200)
HDL: 48.7 mg/dL (ref >39.00)
LDL Cholesterol: 157 mg/dL — ABNORMAL HIGH (ref 0–99)
NonHDL: 184.01
Total CHOL/HDL Ratio: 5
Triglycerides: 133 mg/dL (ref 0.0–149.0)
VLDL: 26.6 mg/dL (ref 0.0–40.0)

## 2021-04-26 LAB — T4, FREE: Free T4: 0.68 ng/dL (ref 0.60–1.60)

## 2021-04-26 LAB — HEMOGLOBIN A1C: Hgb A1c MFr Bld: 7.3 % — ABNORMAL HIGH (ref 4.6–6.5)

## 2021-04-26 LAB — TSH: TSH: 3.31 u[IU]/mL (ref 0.35–5.50)

## 2021-04-26 MED ORDER — TIRZEPATIDE 2.5 MG/0.5ML ~~LOC~~ SOAJ: 2.5000 mg | SUBCUTANEOUS | 0 refills | Status: DC

## 2021-04-26 MED ORDER — VENLAFAXINE HCL ER 37.5 MG PO CP24: 37.5000 mg | ORAL_CAPSULE | Freq: Every day | ORAL | 0 refills | Status: DC

## 2021-04-26 MED ORDER — VENLAFAXINE HCL ER 150 MG PO CP24: 150.0000 mg | ORAL_CAPSULE | Freq: Every day | ORAL | 1 refills | Status: DC

## 2021-04-26 NOTE — Progress Notes (Signed)
Kelli Evans is a 56 y.o. female here for a follow up of a pre-existing problem.  History of Present Illness:   Chief Complaint  Patient presents with   Transfer of care   Knee Pain    Pt c/o left knee pain intermittently   Diabetes    She is doing fasting blood sugars daily ranging 128-135. Denies signs of hypoglycemia or hyperglycemia.   Left Knee Pain  Pt currently c/o intermittent left knee pain that has been onset for several months. Pt describes the pain as a sharp sensation that lasts a couple of minutes and usually occurs when she is walking her dog or around the house. At this point, the pain is occurring about once a week, but is never intense enough for pt to need pain medication. Denies pain radiation, numbness/tingling down leg, swelling, redness, tenderness upon palpation, or decreased ROM.   Anxiety with Depression [name] is compliant with taking Effexor XR 150 mg daily and xanax 0.25 mg prn with no adverse effects. States that prior to being dx, she was experiencing a lot of stress due to her past job and could cry at the drop of a hat. Despite being compliant with her medication, she does still experience great anxiety and decreased moods. Although she does not have SI or a plan, she does admit that if something were to happen to her, she would be okay with it. At this time, she is interested in increasing her Effexor due to belief that she hit a plateau and participating in talk therapy. Denies SI/HI.   GERD Pt is currently compliant with taking prilosec 20 mg daily with no complications. Finds that if she avoids tomatoes and eats healthy/balanced meals along with taking her medication, she has no issues.  Diabetes Current DM meds: Metformin 1000 mg twice daily. Fasting blood sugars at home are checked regularly, ranging 128-135. Patient is compliant with medication and will need a refill today. Denies: hypoglycemic or hyperglycemic episodes or symptoms. Currently she  intakes about one cup of coffee daily and water throughout the day. She does walk her dog regularly as a way to exercise.  Lab Results  Component Value Date   HGBA1C 7.4 (H) 09/29/2020   HLD She is currently non- compliant with taking crestor 20 mg daily due to not being able to have medication refilled. Although she has not been compliant recently, she found that she tolerated this medication well. Denies CP or SOB.   Hx of Hypothyroidism Initially dx 25 years ago in [place], but upon following up with a provider in [place] found that her levels were normal. She has never been on medication for this issue, but has monitored it over the years. Denies concerning sx.   Past Medical History:  Diagnosis Date   Allergy    Anemia    past hx of anemia 2019   Anxiety    Diabetes mellitus without complication (HCC)    GERD (gastroesophageal reflux disease)    Hyperlipidemia    no meds   Hypothyroidism    past hx      Social History   Tobacco Use   Smoking status: Never   Smokeless tobacco: Never  Substance Use Topics   Alcohol use: Yes    Comment: once a month    Drug use: No    Past Surgical History:  Procedure Laterality Date   WISDOM TOOTH EXTRACTION      Family History  Problem Relation Age of Onset  Breast cancer Mother    Cancer Mother 74       breast   Colon polyps Mother    Hypertension Father    Heart disease Father        chf, pacemaker   Cancer Father        brain   Hyperlipidemia Father    Hypothyroidism Father    Brain cancer Father    Colon polyps Father    Cancer Cousin 52       breast    Other Sister        tamoxifen x 3 yrs for a pre cancereous  issue   Breast cancer Maternal Grandfather    Colon cancer Neg Hx    Esophageal cancer Neg Hx    Rectal cancer Neg Hx    Stomach cancer Neg Hx     No Known Allergies  Current Medications:   Current Outpatient Medications:    ALPRAZolam (XANAX) 0.25 MG tablet, 1 po qd prn, Disp: 30 tablet, Rfl: 1    amoxicillin-clavulanate (AUGMENTIN) 875-125 MG tablet, Take 1 tablet by mouth 2 (two) times daily for 10 days., Disp: 20 tablet, Rfl: 0   benzonatate (TESSALON) 100 MG capsule, Take 1 capsule (100 mg total) by mouth 3 (three) times daily as needed for cough., Disp: 21 capsule, Rfl: 0   Blood Glucose Monitoring Suppl (ONETOUCH VERIO) w/Device KIT, Use to check blood sugar once a day.  Dx Code: E11.9, Disp: 1 kit, Rfl: 0   glucose blood (ONETOUCH VERIO) test strip, Use to check blood sugar once a day.  Dx Code: E11.9, Disp: 100 each, Rfl: 1   metFORMIN (GLUCOPHAGE) 1000 MG tablet, Take 1 tablet (1,000 mg total) by mouth 2 (two) times daily with a meal., Disp: 180 tablet, Rfl: 3   Multiple Vitamins-Minerals (ONE-A-DAY 50 PLUS PO), Take by mouth daily., Disp: , Rfl:    omeprazole (PRILOSEC) 20 MG capsule, TAKE 1 CAPSULE(20 MG) BY MOUTH DAILY, Disp: 90 capsule, Rfl: 1   OneTouch Delica Lancets 92J MISC, Use to check blood sugar once a day.  Dx Code: E11.9, Disp: 100 each, Rfl: 1   spironolactone (ALDACTONE) 25 MG tablet, TAKE 1 TABLET(25 MG) BY MOUTH TWICE DAILY, Disp: 180 tablet, Rfl: 1   venlafaxine XR (EFFEXOR-XR) 150 MG 24 hr capsule, Take 1 capsule (150 mg total) by mouth daily with breakfast., Disp: 90 capsule, Rfl: 3   rosuvastatin (CRESTOR) 20 MG tablet, Take 1 tablet (20 mg total) by mouth daily. (Patient not taking: Reported on 04/26/2021), Disp: 30 tablet, Rfl: 0   Review of Systems:   ROS Negative unless otherwise specified per HPI. Vitals:   Vitals:   04/26/21 1337  BP: 120/84  Pulse: 88  Temp: 97.7 F (36.5 C)  TempSrc: Temporal  SpO2: 97%  Weight: 202 lb 4 oz (91.7 kg)  Height: 5' 7"  (1.702 m)     Body mass index is 31.68 kg/m.  Physical Exam:   Physical Exam Vitals and nursing note reviewed.  Constitutional:      General: She is not in acute distress.    Appearance: She is well-developed. She is not ill-appearing or toxic-appearing.  Cardiovascular:     Rate and  Rhythm: Normal rate and regular rhythm.     Pulses: Normal pulses.     Heart sounds: Normal heart sounds, S1 normal and S2 normal.  Pulmonary:     Effort: Pulmonary effort is normal.     Breath sounds: Normal breath sounds.  Musculoskeletal:  Left knee: No swelling, erythema or bony tenderness. Normal range of motion. No tenderness.  Skin:    General: Skin is warm and dry.  Neurological:     Mental Status: She is alert.     GCS: GCS eye subscore is 4. GCS verbal subscore is 5. GCS motor subscore is 6.  Psychiatric:        Speech: Speech normal.        Behavior: Behavior normal. Behavior is cooperative.    Assessment and Plan:   Gastroesophageal reflux disease, unspecified whether esophagitis present Controlled Continue prilosec 20 mg daily  Encouraged patient to continue avoidance of spicy/high acidic foods Follow up as needed  Hypothyroidism, unspecified type Update labs today, will make recommendations accordingly to start levothyroxine if indicated  Depression with anxiety Uncontrolled Patient denies SI/HI Increase Effexor XR to 187.5 mg daily Continue Xanax 0.25 mg prn Will send referral for talk therapy  I discussed with patient that if they develop any SI, to tell someone immediately and seek medical attention. Follow up in 4-6 weeks, via virtual or in office, sooner if concerns occur  Uncontrolled type 2 diabetes mellitus with hyperglycemia ([place]) Update HgbA1c today, will adjust metformin 1000 mg twice daily as indicated by results Start mounjaro 2.5 mg weekly injection for diabetes  Encouraged patient to participate in healthy eating and regular exercise   Acute pain of left knee No red flags Suspect possible OA; no obvious need for xray today Trial ACE bandage on knee for long walks Rest/ice/NSAIDS prn Follow up if symptoms worsen or concerns occur  Hyperlipidemia associated with type 2 diabetes mellitus ([place]) Update lipid panel today, will restart  crestor medication as indicated   I,Havlyn C Ratchford,acting as a scribe for [employer], PA.,have documented all relevant documentation on the behalf of Inda Coke, PA,as directed by  Inda Coke, PA while in the presence of Inda Coke, [place].  I, Inda Coke, [place], have reviewed all documentation for this visit. The documentation on 04/26/21 for the exam, diagnosis, procedures, and orders are all accurate and complete.  Time spent with patient today was 42 minutes which consisted of chart review, discussing diagnosis, work up, treatment answering questions and documentation.  Inda Coke, PA-C

## 2021-04-26 NOTE — Patient Instructions (Addendum)
It was great to see you!  Increase effexor to 187.5 mg XR daily. I'm sending in updated rx now. You will take effexor-xr 150 mg + effexor-xr 37.5 mg daily.  Talk therapy referral placed, you should get a phone call about this.  If you develop suicidal thoughts, please tell someone and immediately proceed to our local 24/7 crisis center, [facility] at the [facility]. [address] [place], [place] [address] [phone].  Trial the ACE bandage on your knee for long walks. If symptoms worsen, let me know.  Hold on to the [facility] sample. I will advise on starting this once your blood work results are in.  I will update you cholesterol levels today also and send in the appropriate dose of crestor.  Let's follow-up in 4-6 weeks -- VIRTUAL OK IF DESIRED, sooner if you have concerns.  If a referral was placed today, you will be contacted for an appointment. Please note that routine referrals can sometimes take up to 3-4 weeks to process. Please call our office if you haven't heard anything after this time frame.  Take care,  Inda Coke PA-C

## 2021-04-28 ENCOUNTER — Other Ambulatory Visit: Payer: Self-pay | Admitting: Physician Assistant

## 2021-04-28 MED ORDER — ROSUVASTATIN CALCIUM 20 MG PO TABS: 20.0000 mg | ORAL_TABLET | Freq: Every day | ORAL | 1 refills | Status: DC

## 2021-05-14 ENCOUNTER — Encounter: Payer: Self-pay | Admitting: Physician Assistant

## 2021-05-15 MED ORDER — TIRZEPATIDE 5 MG/0.5ML ~~LOC~~ SOAJ: 5.0000 mg | SUBCUTANEOUS | 0 refills | Status: DC

## 2021-05-16 NOTE — Telephone Encounter (Signed)
Please see message from [facility] regarding Mounjaro 5 mg. Please advise.

## 2021-05-16 NOTE — Telephone Encounter (Signed)
Called [facility] at [phone] spoke to Williams. Told her calling to do prior authorization on Mounjaro 5 mg. [name] said it can only be done if medically necessary. Told her even if pt is diabetic. She said yes, but needs to try 3 out of 4 medications:Ozempic, Trulicity, Rybelsus and Victoza before it will be considered. Told her okay thank you.

## 2021-05-17 MED ORDER — OZEMPIC (0.25 OR 0.5 MG/DOSE) 2 MG/1.5ML ~~LOC~~ SOPN: 0.5000 mg | PEN_INJECTOR | SUBCUTANEOUS | 0 refills | Status: DC

## 2021-05-17 NOTE — Addendum Note (Signed)
Addended by: Marian Sorrow on: 05/17/2021 02:12 PM   Modules accepted: Orders

## 2021-06-14 ENCOUNTER — Other Ambulatory Visit: Payer: Self-pay | Admitting: Physician Assistant

## 2021-07-13 IMAGING — US US PELVIS COMPLETE WITH TRANSVAGINAL
1 series · 13 of 25 positions shown · non-contrast
Comparison: None

CLINICAL DATA: Cervical polyp; patient states postmenopausal with
LMP 05/08/2019



[Series 1: us pelvic complete with transvaginal · 13 of 125 slices shown]
[im 1/125]
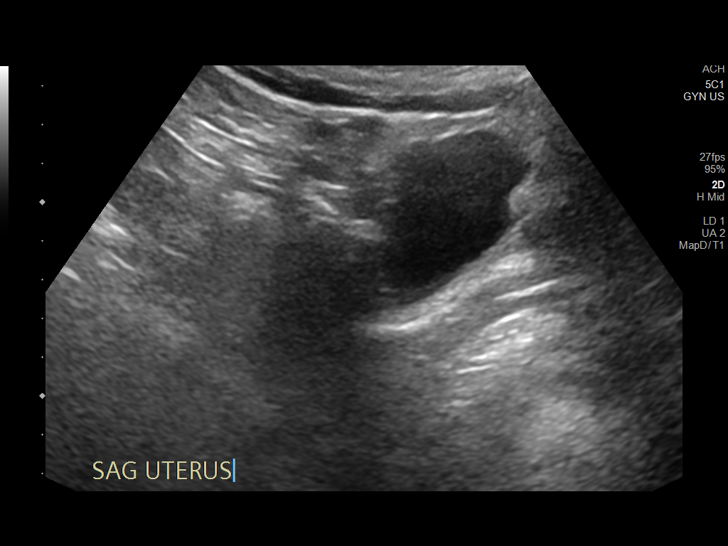
[im 11/125]
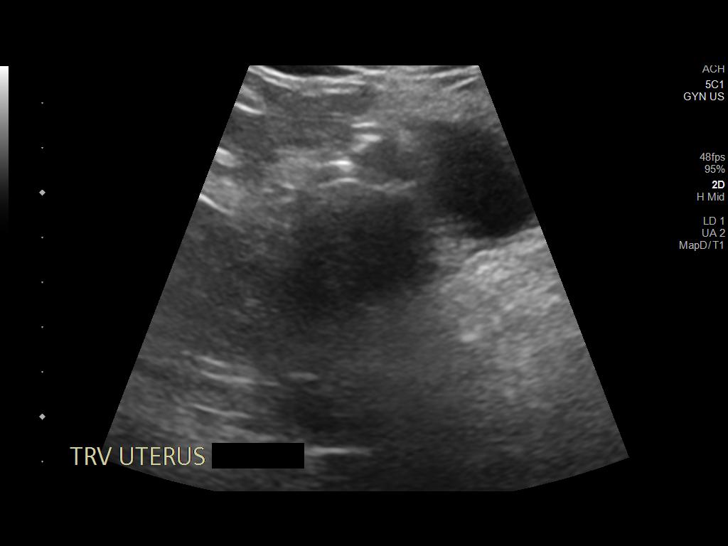
[im 21/125]
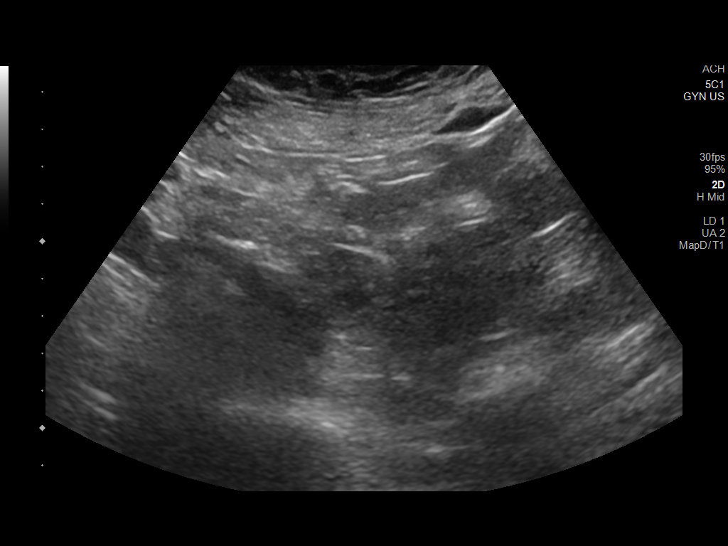
[im 32/125]
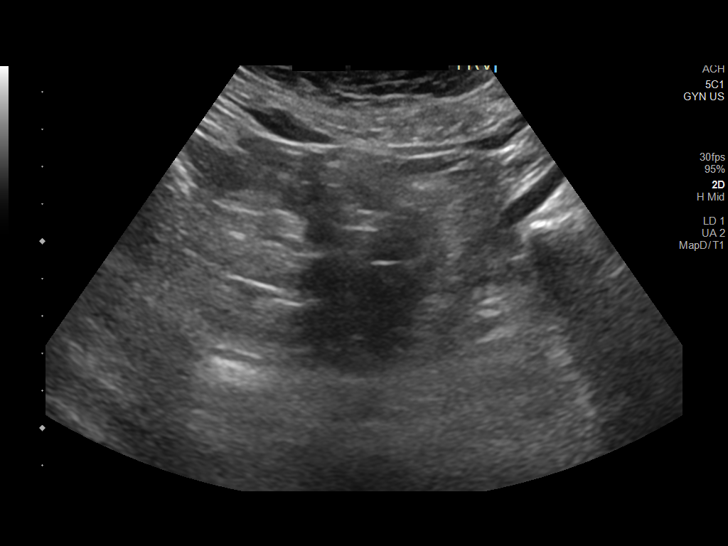
[im 42/125]
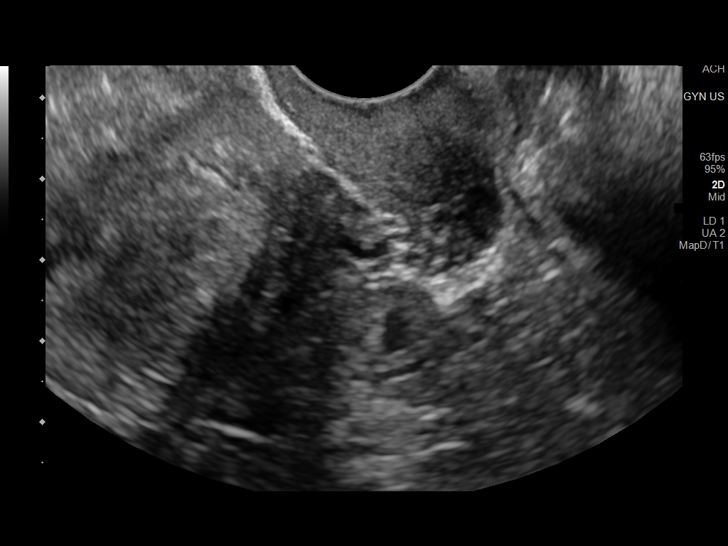
[im 52/125]
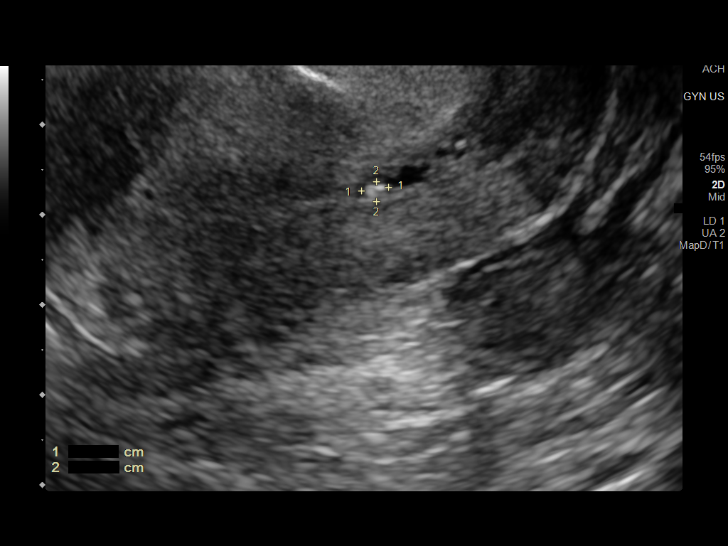
[im 63/125]
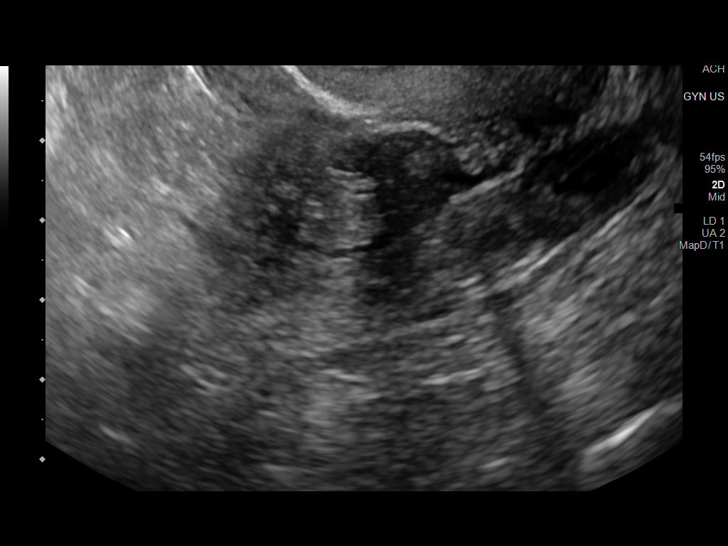
[im 73/125]
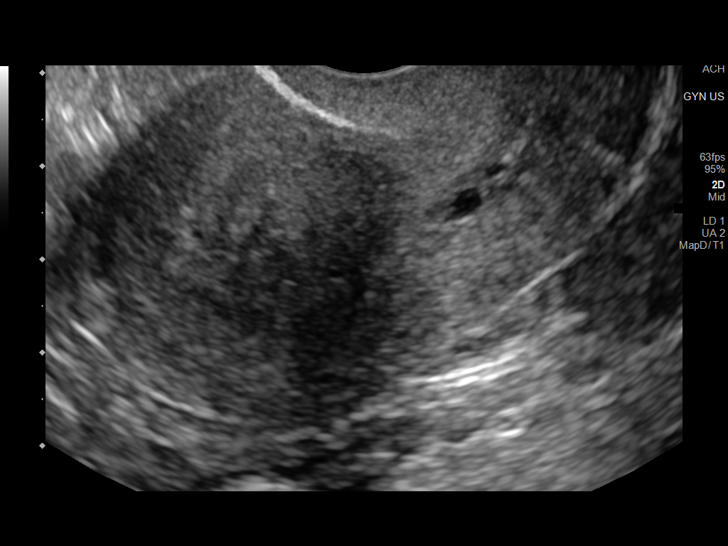
[im 83/125]
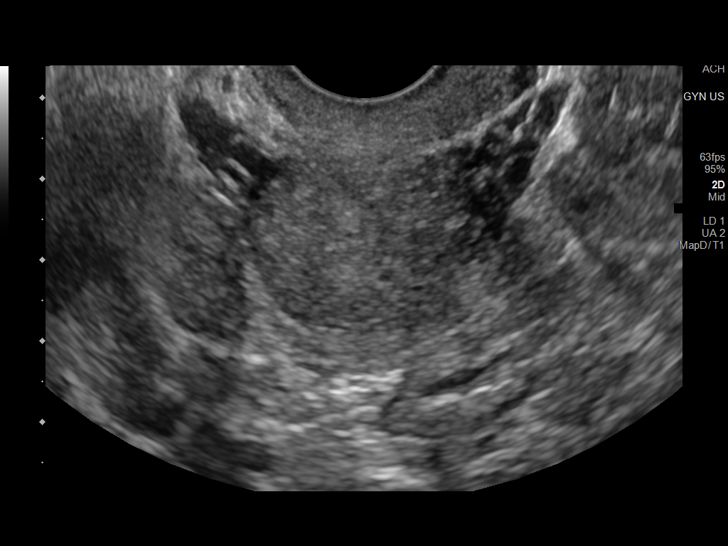
[im 94/125]
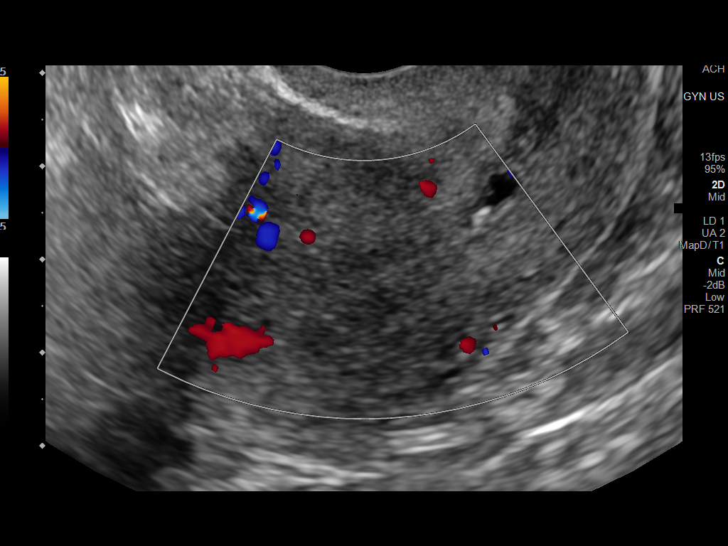
[im 104/125]
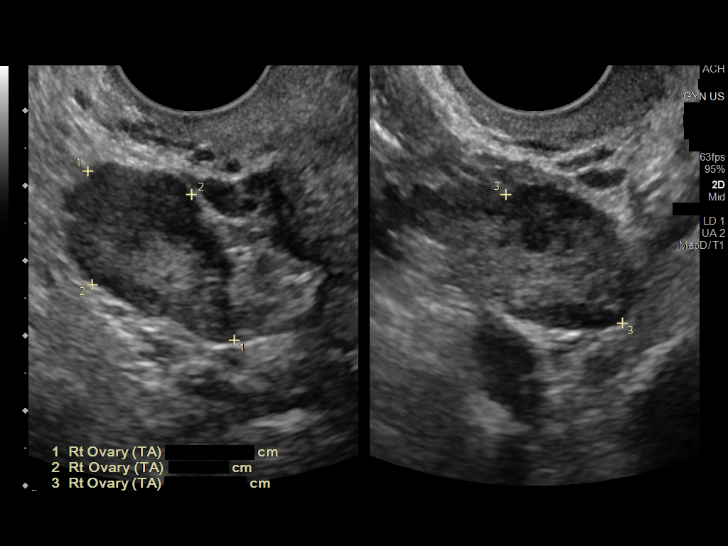
[im 114/125]
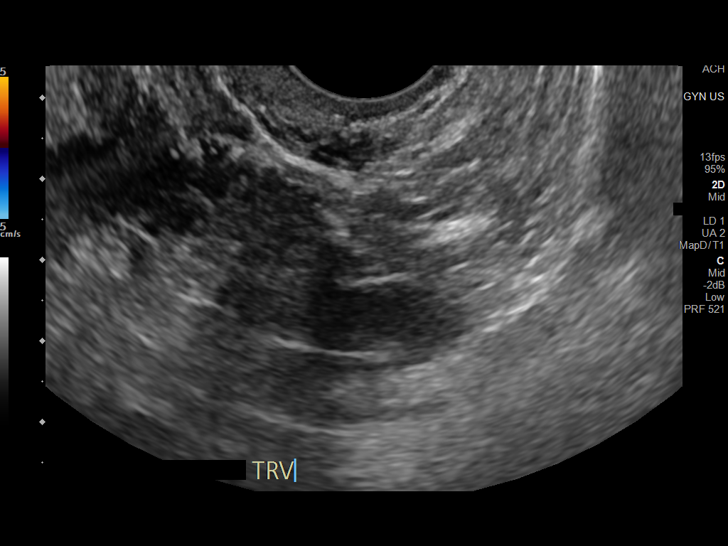
[im 125/125]
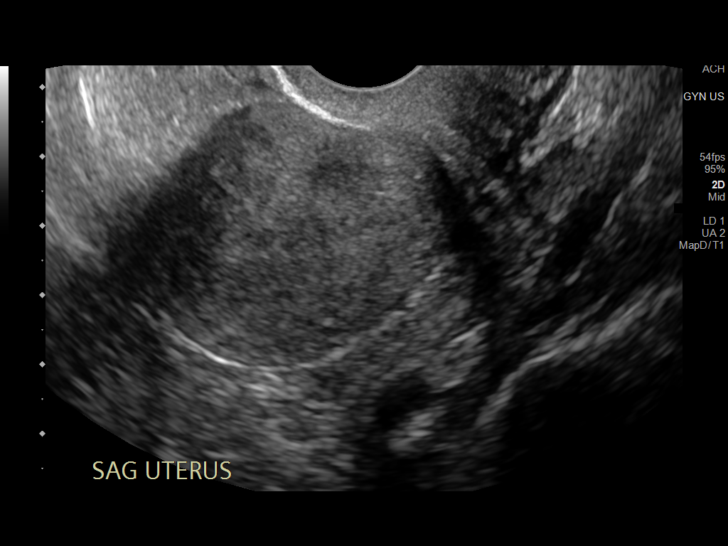

[13 of 25 positions shown; findings below may reference images not displayed]

FINDINGS: Uterus

Measurements: 6.0 x 2.9 x 3.2 cm = volume: 29 mL. Anteverted. Normal
morphology with probable small submucosal leiomyoma at anterior
uterus 12 x 12 x 11 mm.

Endometrium

Thickness: 5 mm. Small amount of endocervical fluid. Several tiny
brightly hyperechoic foci are identified at the endocervical canal,
2 mm and 3 mm in greatest sizes, likely tiny calcifications.
Nabothian cyst at cervix. No definite cervical polyp is identified.

Right ovary

Measurements: 3.0 x 1.8 x 2.3 cm = volume: 6.5 mL. Normal morphology
without mass

Left ovary

Measurements: 3.7 x 1.2 x 2.5 cm = volume: 5.9 mL. Normal morphology
without mass

Other findings

No free pelvic fluid.  No adnexal masses.
IMPRESSION: Small amount of endocervical fluid and several tiny calcifications
at the cervical canal.

No definite cervical polyp identified.

Probable 12 mm submucosal leiomyoma at anterior mid uterus.

## 2021-08-21 ENCOUNTER — Encounter: Payer: Self-pay | Admitting: Physician Assistant

## 2021-08-23 ENCOUNTER — Encounter: Payer: Self-pay | Admitting: Physician Assistant

## 2021-08-23 ENCOUNTER — Ambulatory Visit: Payer: BC Managed Care – PPO | Admitting: Physician Assistant

## 2021-08-23 VITALS — BP 120/84 | HR 74 | Temp 97.9°F | Ht 67.0 in | Wt 189.2 lb

## 2021-08-23 DIAGNOSIS — K219 Gastro-esophageal reflux disease without esophagitis: Secondary | ICD-10-CM

## 2021-08-23 DIAGNOSIS — E119 Type 2 diabetes mellitus without complications: Secondary | ICD-10-CM

## 2021-08-23 DIAGNOSIS — E785 Hyperlipidemia, unspecified: Secondary | ICD-10-CM

## 2021-08-23 DIAGNOSIS — E1169 Type 2 diabetes mellitus with other specified complication: Secondary | ICD-10-CM | POA: Diagnosis not present

## 2021-08-23 MED ORDER — SEMAGLUTIDE (1 MG/DOSE) 4 MG/3ML ~~LOC~~ SOPN: 1.0000 mg | PEN_INJECTOR | SUBCUTANEOUS | 1 refills | Status: DC

## 2021-08-23 MED ORDER — OMEPRAZOLE 20 MG PO CPDR: DELAYED_RELEASE_CAPSULE | ORAL | 1 refills | Status: DC

## 2021-08-23 MED ORDER — VENLAFAXINE HCL ER 37.5 MG PO CP24: 37.5000 mg | ORAL_CAPSULE | Freq: Every day | ORAL | 1 refills | Status: DC

## 2021-08-23 MED ORDER — VENLAFAXINE HCL ER 150 MG PO CP24: 150.0000 mg | ORAL_CAPSULE | Freq: Every day | ORAL | 1 refills | Status: DC

## 2021-08-23 NOTE — Patient Instructions (Addendum)
It was great to see you!  Keep up the good work! I will be in touch with your blood work and my recommendations.  Increase Ozempic to 1 mg.  Let's follow-up after 3 months for a physical, sooner if you have concerns.  Take care,  Inda Coke PA-C

## 2021-08-23 NOTE — Progress Notes (Signed)
Kelli Evans is a 56 y.o. female here for a follow up of a pre-existing problem.  History of Present Illness:   Chief Complaint  Patient presents with   Diabetes    HPI  Diabetes  Patient here to follow up. Last seen about 4 months ago. She is currently taking Ozempic 0.5 mg injection once a week and Metformin 1000 mg twice daily. She is compliant with her medication and has been managing well. She feels like Darcel Bayley has worked well for her than Ozempic but this was not covered by her insurance unfortunately.  States fasting sugar at home are checked regularly and ranging from 114-120. States her fasting blood sugar was 128 this morning. She had dinner late yesterday night and think this could be contributing. She has lost 13 pounds since last visit with Ozempic. Denies: hypoglycemic or hyperglycemic episodes or symptoms. Denies any concerning sx.   GERD Patient is currently compliant with taking prilosec 20 mg daily with no complications. Does get heartburn occasionally. She has been trying to follow healthy diet. She is requesting refill on prilosec 20 mg daily today. Denies any worsening symptoms.   HLD Currently taking crestor 20 mg nightly but would like to take at a different part of the day because this causes worsening GERD. She is otherwise stable without concerns today.  Past Medical History:  Diagnosis Date   Allergy    Anemia    past hx of anemia 2019   Anxiety    Diabetes mellitus without complication (HCC)    GERD (gastroesophageal reflux disease)    Hyperlipidemia    no meds   Hypothyroidism    past hx      Social History   Tobacco Use   Smoking status: Never   Smokeless tobacco: Never  Substance Use Topics   Alcohol use: Yes    Comment: once a month    Drug use: No    Past Surgical History:  Procedure Laterality Date   WISDOM TOOTH EXTRACTION      Family History  Problem Relation Age of Onset   Breast cancer Mother    Cancer Mother 15        breast   Colon polyps Mother    Hypertension Father    Heart disease Father        chf, pacemaker   Cancer Father        brain   Hyperlipidemia Father    Hypothyroidism Father    Brain cancer Father    Colon polyps Father    Cancer Cousin 21       breast    Other Sister        tamoxifen x 3 yrs for a pre cancereous  issue   Breast cancer Maternal Grandfather    Colon cancer Neg Hx    Esophageal cancer Neg Hx    Rectal cancer Neg Hx    Stomach cancer Neg Hx     No Known Allergies  Current Medications:   Current Outpatient Medications:    Blood Glucose Monitoring Suppl (ONETOUCH VERIO) w/Device KIT, Use to check blood sugar once a day.  Dx Code: E11.9, Disp: 1 kit, Rfl: 0   glucose blood (ONETOUCH VERIO) test strip, Use to check blood sugar once a day.  Dx Code: E11.9, Disp: 100 each, Rfl: 1   metFORMIN (GLUCOPHAGE) 1000 MG tablet, Take 1 tablet (1,000 mg total) by mouth 2 (two) times daily with a meal., Disp: 180 tablet, Rfl: 3  Multiple Vitamins-Minerals (ONE-A-DAY 50 PLUS PO), Take by mouth daily., Disp: , Rfl:    OneTouch Delica Lancets 93Z MISC, Use to check blood sugar once a day.  Dx Code: E11.9, Disp: 100 each, Rfl: 1   OZEMPIC, 0.25 OR 0.5 MG/DOSE, 2 MG/1.5ML SOPN, INJECT 0.5 MG UNDER THE SKIN ONCE A WEEK, Disp: 1.5 mL, Rfl: 2   rosuvastatin (CRESTOR) 20 MG tablet, Take 1 tablet (20 mg total) by mouth daily., Disp: 90 tablet, Rfl: 1   spironolactone (ALDACTONE) 25 MG tablet, TAKE 1 TABLET(25 MG) BY MOUTH TWICE DAILY, Disp: 180 tablet, Rfl: 1   venlafaxine XR (EFFEXOR-XR) 150 MG 24 hr capsule, Take 1 capsule (150 mg total) by mouth daily with breakfast. To be taken with 37.5 mg xr capsule., Disp: 90 capsule, Rfl: 1   omeprazole (PRILOSEC) 20 MG capsule, TAKE 1 CAPSULE(20 MG) BY MOUTH DAILY, Disp: 90 capsule, Rfl: 1   venlafaxine XR (EFFEXOR-XR) 37.5 MG 24 hr capsule, Take 1 capsule (37.5 mg total) by mouth daily with breakfast. Take 1 capsule with 150 mg XR capsule daily.,  Disp: 90 capsule, Rfl: 1   Review of Systems:   ROS Negative unless otherwise specified per HPI.   Vitals:   Vitals:   08/23/21 1513  BP: 120/84  Pulse: 74  Temp: 97.9 F (36.6 C)  TempSrc: Temporal  SpO2: 98%  Weight: 189 lb 4 oz (85.8 kg)  Height: 5' 7" (1.702 m)     Body mass index is 29.64 kg/m.  Physical Exam:   Physical Exam Vitals and nursing note reviewed.  Constitutional:      General: She is not in acute distress.    Appearance: She is well-developed. She is not ill-appearing or toxic-appearing.  Cardiovascular:     Rate and Rhythm: Normal rate and regular rhythm.     Pulses: Normal pulses.     Heart sounds: Normal heart sounds, S1 normal and S2 normal.  Pulmonary:     Effort: Pulmonary effort is normal.     Breath sounds: Normal breath sounds.  Skin:    General: Skin is warm and dry.  Neurological:     Mental Status: She is alert.     GCS: GCS eye subscore is 4. GCS verbal subscore is 5. GCS motor subscore is 6.  Psychiatric:        Speech: Speech normal.        Behavior: Behavior normal. Behavior is cooperative.    Assessment and Plan:   Type 2 diabetes mellitus without complication, without long-term current use of insulin (HCC) Update A1c and provide recommendations accordingly Increase Ozempic 1 mg May stop/decrease metformin based on A1c results Follow-up in 3-6 months  Gastroesophageal reflux disease Controlled Continue omeprazole 20 mg daily  Hyperlipidemia associated with type 2 diabetes mellitus ([place]) Update lipid panel and adjust crestor 20 mg daily as indicated  I,Savera Zaman,acting as a scribe for [employer], PA.,have documented all relevant documentation on the behalf of Inda Coke, PA,as directed by  Inda Coke, PA while in the presence of Inda Coke, [place].   I, Inda Coke, [place], have reviewed all documentation for this visit. The documentation on 08/23/21 for the exam, diagnosis, procedures, and orders are  all accurate and complete.   Inda Coke, PA-C

## 2021-08-24 LAB — LIPID PANEL
Cholesterol: 186 mg/dL (ref 0–200)
HDL: 55.9 mg/dL (ref >39.00)
LDL Cholesterol: 117 mg/dL — ABNORMAL HIGH (ref 0–99)
NonHDL: 129.61
Total CHOL/HDL Ratio: 3
Triglycerides: 61 mg/dL (ref 0.0–149.0)
VLDL: 12.2 mg/dL (ref 0.0–40.0)

## 2021-08-24 LAB — HEMOGLOBIN A1C: Hgb A1c MFr Bld: 6.3 % (ref 4.6–6.5)

## 2021-08-25 ENCOUNTER — Other Ambulatory Visit: Payer: Self-pay | Admitting: Physician Assistant

## 2021-08-25 MED ORDER — ROSUVASTATIN CALCIUM 40 MG PO TABS: 40.0000 mg | ORAL_TABLET | Freq: Every day | ORAL | 3 refills | Status: DC

## 2021-10-10 ENCOUNTER — Other Ambulatory Visit: Payer: Self-pay | Admitting: Physician Assistant

## 2021-10-10 DIAGNOSIS — Z1231 Encounter for screening mammogram for malignant neoplasm of breast: Secondary | ICD-10-CM

## 2021-10-15 ENCOUNTER — Other Ambulatory Visit: Payer: Self-pay | Admitting: Family Medicine

## 2021-10-20 ENCOUNTER — Ambulatory Visit: Payer: BC Managed Care – PPO

## 2021-10-27 ENCOUNTER — Ambulatory Visit: Payer: BC Managed Care – PPO

## 2021-11-06 ENCOUNTER — Ambulatory Visit (INDEPENDENT_AMBULATORY_CARE_PROVIDER_SITE_OTHER): Payer: BC Managed Care – PPO | Admitting: Physician Assistant

## 2021-11-06 ENCOUNTER — Encounter: Payer: Self-pay | Admitting: Physician Assistant

## 2021-11-06 VITALS — BP 98/62 | HR 51 | Temp 97.6°F | Ht 67.0 in | Wt 186.4 lb

## 2021-11-06 DIAGNOSIS — R229 Localized swelling, mass and lump, unspecified: Secondary | ICD-10-CM

## 2021-11-06 DIAGNOSIS — F418 Other specified anxiety disorders: Secondary | ICD-10-CM

## 2021-11-06 DIAGNOSIS — E119 Type 2 diabetes mellitus without complications: Secondary | ICD-10-CM

## 2021-11-06 DIAGNOSIS — E663 Overweight: Secondary | ICD-10-CM

## 2021-11-06 DIAGNOSIS — E1169 Type 2 diabetes mellitus with other specified complication: Secondary | ICD-10-CM

## 2021-11-06 DIAGNOSIS — Z0001 Encounter for general adult medical examination with abnormal findings: Secondary | ICD-10-CM | POA: Diagnosis not present

## 2021-11-06 DIAGNOSIS — E785 Hyperlipidemia, unspecified: Secondary | ICD-10-CM

## 2021-11-06 MED ORDER — SEMAGLUTIDE (2 MG/DOSE) 8 MG/3ML ~~LOC~~ SOPN: 2.0000 mg | PEN_INJECTOR | SUBCUTANEOUS | 1 refills | Status: DC

## 2021-11-06 NOTE — Progress Notes (Signed)
Subjective:    Kelli Evans is a 56 y.o. female and is here for a comprehensive physical exam.  HPI  Health Maintenance Due  Topic Date Due   HIV Screening  Never done   FOOT EXAM  09/29/2021   URINE MICROALBUMIN  09/29/2021   INFLUENZA VACCINE  10/24/2021   Acute Concerns: None  Chronic Issues: Diabetes 2.5 month follow-up. Current DM meds: Ozempic 1 mg weekly. Blood sugars at home are: 94 - 100. Patient is compliant with medications. Denies: hypoglycemic or hyperglycemic episodes or symptoms. This patient's diabetes is complicated by HLD.  Lab Results  Component Value Date   HGBA1C 6.3 08/23/2021   HLD Currently taking crestor 40 mg daily. Tolerating well. Denies myalgias.  Depression with anxiety Currently taking 187.5 mg effexor xr daily. Tolerating well. Denies SI/HI.  Health Maintenance: Immunizations -- UTD Colonoscopy -- UTD; due in 2030 Mammogram -- scheduled for later this month PAP -- UTD, due in 2024 Bone Density -- n/a Diet -- overall balanced Sleep habits -- has issues with her pets waking her up, denies need for rx Exercise -- walking regularly Weight -- Weight: 186 lb 6.4 oz (84.6 kg)  Mood -- stable Weight history: Wt Readings from Last 10 Encounters:  11/06/21 186 lb 6.4 oz (84.6 kg)  08/23/21 189 lb 4 oz (85.8 kg)  04/26/21 202 lb 4 oz (91.7 kg)  09/29/20 202 lb 9.6 oz (91.9 kg)  10/26/19 195 lb (88.5 kg)  09/25/19 187 lb 9.6 oz (85.1 kg)  02/12/19 206 lb (93.4 kg)  01/20/19 210 lb (95.3 kg)  10/13/18 220 lb (99.8 kg)  09/29/18 220 lb (99.8 kg)   Body mass index is 29.19 kg/m. Patient's last menstrual period was 08/09/2019 (exact date). Alcohol use:  reports current alcohol use of about 2.0 standard drinks of alcohol per week. Tobacco use:  Tobacco Use: Low Risk  (11/06/2021)   Patient History    Smoking Tobacco Use: Never    Smokeless Tobacco Use: Never    Passive Exposure: Not on file        11/06/2021    1:08 PM   Depression screen PHQ 2/9  Decreased Interest 0  Down, Depressed, Hopeless 0  PHQ - 2 Score 0     Other providers/specialists: Patient Care Team: Inda Coke, [place] as PCP - General (Physician Assistant) Sydnee Levans, MD as Referring Physician (Dermatology)    PMHx, SurgHx, SocialHx, Medications, and Allergies were reviewed in the Visit Navigator and updated as appropriate.   Past Medical History:  Diagnosis Date   Allergy    Anemia    past hx of anemia 2019   Anxiety    Depression    Diabetes mellitus without complication (HCC)    GERD (gastroesophageal reflux disease)    Hyperlipidemia    no meds   Hypothyroidism    past hx      Past Surgical History:  Procedure Laterality Date   WISDOM TOOTH EXTRACTION       Family History  Problem Relation Age of Onset   Breast cancer Mother    Cancer Mother 64       breast   Colon polyps Mother    Hypertension Father    Heart disease Father        chf, pacemaker   Cancer Father        brain   Hyperlipidemia Father    Hypothyroidism Father    Brain cancer Father    Colon polyps Father  Arthritis Father    Hearing loss Father    Cancer Cousin 47       breast    Other Sister        tamoxifen x 3 yrs for a pre cancereous  issue   Breast cancer Maternal Grandfather    Cancer Maternal Grandfather    Cancer Maternal Grandmother    Colon cancer Neg Hx    Esophageal cancer Neg Hx    Rectal cancer Neg Hx    Stomach cancer Neg Hx     Social History   Tobacco Use   Smoking status: Never   Smokeless tobacco: Never  Substance Use Topics   Alcohol use: Yes    Alcohol/week: 2.0 standard drinks of alcohol    Types: 1 Glasses of wine, 1 Standard drinks or equivalent per week    Comment: once a month    Drug use: No    Review of Systems:   Review of Systems  Constitutional:  Negative for chills, fever, malaise/fatigue and weight loss.  HENT:  Negative for hearing loss, sinus pain and sore throat.    Respiratory:  Negative for cough and hemoptysis.   Cardiovascular:  Negative for chest pain, palpitations, leg swelling and PND.  Gastrointestinal:  Negative for abdominal pain, constipation, diarrhea, heartburn, nausea and vomiting.  Genitourinary:  Negative for dysuria, frequency and urgency.  Musculoskeletal:  Negative for back pain, myalgias and neck pain.  Skin:  Negative for itching and rash.  Neurological:  Negative for dizziness, tingling, seizures and headaches.  Endo/Heme/Allergies:  Negative for polydipsia.  Psychiatric/Behavioral:  Negative for depression. The patient is not nervous/anxious.     Objective:   BP 98/62   Pulse (!) 51   Temp 97.6 F (36.4 C)   Ht '5\' 7"'$  (1.702 m)   Wt 186 lb 6.4 oz (84.6 kg)   LMP 08/09/2019 (Exact Date)   SpO2 97%   BMI 29.19 kg/m  Body mass index is 29.19 kg/m.   General Appearance:    Alert, cooperative, no distress, appears stated age  Head:    Normocephalic, without obvious abnormality, atraumatic  Eyes:    PERRL, conjunctiva/corneas clear, EOM's intact, fundi    benign, both eyes  Ears:    Normal TM's and external ear canals, both ears  Nose:   Nares normal, septum midline, mucosa normal, no drainage    or sinus tenderness  Throat:   Lips, mucosa, and tongue normal; teeth and gums normal  Neck:   Supple, symmetrical, trachea midline, no adenopathy;    thyroid:  no enlargement/tenderness/nodules; no carotid   bruit or JVD  Back:     Symmetric, no curvature, ROM normal, no CVA tenderness  Lungs:     Clear to auscultation bilaterally, respirations unlabored  Chest Wall:    Irregular raised area at chest wall near R clavicle with some pointed TTP   Heart:    Regular rate and rhythm, S1 and S2 normal, no murmur, rub or gallop  Breast Exam:    Deferred  Abdomen:     Soft, non-tender, bowel sounds active all four quadrants,    no masses, no organomegaly  Genitalia:    Deferred  Extremities:   Extremities normal, atraumatic, no  cyanosis or edema  Pulses:   2+ and symmetric all extremities  Skin:   Skin color, texture, turgor normal, no rashes or lesions  Lymph nodes:   Cervical, supraclavicular, and axillary nodes normal  Neurologic:   CNII-XII intact, normal strength, sensation  and reflexes    throughout    Assessment/Plan:   Encounter for general adult medical examination with abnormal findings Today patient counseled on age appropriate routine health concerns for screening and prevention, each reviewed and up to date or declined. Immunizations reviewed and up to date or declined. Labs ordered and reviewed. Risk factors for depression reviewed and negative. Hearing function and visual acuity are intact. ADLs screened and addressed as needed. Functional ability and level of safety reviewed and appropriate. Education, counseling and referrals performed based on assessed risks today. Patient provided with a copy of personalized plan for preventive services.  Type 2 diabetes mellitus without complication, without long-term current use of insulin ([place]) Too soon to recheck A1c today Will recheck after 8/29 -- future labs placed Will go ahead and increase ozempic to max dose for weight loss at 2 mg weekly Follow-up in 6 months, sooner if A1c is uncontrolled  Hyperlipidemia associated with type 2 diabetes mellitus ([place]) Update lipid panel and adjust crestor 40 mg as indicated  Depression with anxiety Well controlled with effexor 187.5 mg weekly Denies SI/HI Follow-up  Skin nodule Unclear etiology U/S ordered - I did also recommend that she reach out to her mammogram office to see if this would require any changes in her order  Overweight Continue efforts at healthy lifestyle  Patient Counseling: '[x]'$    Nutrition: Stressed importance of moderation in sodium/caffeine intake, saturated fat and cholesterol, caloric balance, sufficient intake of fresh fruits, vegetables, fiber, calcium, iron, and 1 mg of folate  supplement per day (for females capable of pregnancy).  '[x]'$    Stressed the importance of regular exercise.   '[x]'$    Substance Abuse: Discussed cessation/primary prevention of tobacco, alcohol, or other drug use; driving or other dangerous activities under the influence; availability of treatment for abuse.   '[x]'$    Injury prevention: Discussed safety belts, safety helmets, smoke detector, smoking near bedding or upholstery.   '[x]'$    Sexuality: Discussed sexually transmitted diseases, partner selection, use of condoms, avoidance of unintended pregnancy  and contraceptive alternatives.  '[x]'$    Dental health: Discussed importance of regular tooth brushing, flossing, and dental visits.  '[x]'$    Health maintenance and immunizations reviewed. Please refer to Health maintenance section.    Inda Coke, PA-C [place]

## 2021-11-06 NOTE — Patient Instructions (Addendum)
It was great to see you!  Plan for blood work and urine tests after Aug 29 -- you can schedule this  Call to schedule your ultrasound of your upper chest -- [phone] and be sure to tell them about this when you go for your mammogram  Increase Ozempic to 2 mg weekly  Follow-up in 6 months, sooner if concerns  Take care,  Aldona Bar

## 2021-11-08 ENCOUNTER — Ambulatory Visit
Admission: RE | Admit: 2021-11-08 | Discharge: 2021-11-08 | Disposition: A | Discharge status: Home / Self Care | Payer: BC Managed Care – PPO | Source: Ambulatory Visit | Attending: Physician Assistant | Admitting: Physician Assistant

## 2021-11-08 DIAGNOSIS — Z1231 Encounter for screening mammogram for malignant neoplasm of breast: Secondary | ICD-10-CM

## 2021-11-08 DIAGNOSIS — R229 Localized swelling, mass and lump, unspecified: Secondary | ICD-10-CM

## 2021-11-25 ENCOUNTER — Encounter: Payer: Self-pay | Admitting: Physician Assistant

## 2021-11-28 ENCOUNTER — Other Ambulatory Visit: Payer: Self-pay | Admitting: Physician Assistant

## 2021-11-28 ENCOUNTER — Ambulatory Visit (HOSPITAL_COMMUNITY): Payer: BC Managed Care – PPO

## 2021-11-28 DIAGNOSIS — R229 Localized swelling, mass and lump, unspecified: Secondary | ICD-10-CM

## 2021-11-29 ENCOUNTER — Ambulatory Visit (HOSPITAL_BASED_OUTPATIENT_CLINIC_OR_DEPARTMENT_OTHER)
Admission: RE | Admit: 2021-11-29 | Discharge: 2021-11-29 | Disposition: A | Discharge status: Home / Self Care | Payer: BC Managed Care – PPO | Source: Ambulatory Visit | Attending: Physician Assistant | Admitting: Physician Assistant

## 2021-11-29 ENCOUNTER — Other Ambulatory Visit (INDEPENDENT_AMBULATORY_CARE_PROVIDER_SITE_OTHER): Payer: BC Managed Care – PPO

## 2021-11-29 ENCOUNTER — Other Ambulatory Visit: Payer: BC Managed Care – PPO

## 2021-11-29 DIAGNOSIS — E119 Type 2 diabetes mellitus without complications: Secondary | ICD-10-CM | POA: Diagnosis not present

## 2021-11-29 DIAGNOSIS — R229 Localized swelling, mass and lump, unspecified: Secondary | ICD-10-CM | POA: Diagnosis not present

## 2021-11-29 DIAGNOSIS — E785 Hyperlipidemia, unspecified: Secondary | ICD-10-CM | POA: Diagnosis not present

## 2021-11-29 DIAGNOSIS — E1169 Type 2 diabetes mellitus with other specified complication: Secondary | ICD-10-CM | POA: Diagnosis not present

## 2021-11-29 LAB — CBC WITH DIFFERENTIAL/PLATELET
Basophils Absolute: 0.1 10*3/uL (ref 0.0–0.1)
Basophils Relative: 0.9 % (ref 0.0–3.0)
Eosinophils Absolute: 0.1 10*3/uL (ref 0.0–0.7)
Eosinophils Relative: 1.8 % (ref 0.0–5.0)
HCT: 37.8 % (ref 36.0–46.0)
Hemoglobin: 12.7 g/dL (ref 12.0–15.0)
Lymphocytes Relative: 27 % (ref 12.0–46.0)
Lymphs Abs: 1.9 10*3/uL (ref 0.7–4.0)
MCHC: 33.7 g/dL (ref 30.0–36.0)
MCV: 82.7 fl (ref 78.0–100.0)
Monocytes Absolute: 0.5 10*3/uL (ref 0.1–1.0)
Monocytes Relative: 6.5 % (ref 3.0–12.0)
Neutro Abs: 4.5 10*3/uL (ref 1.4–7.7)
Neutrophils Relative %: 63.8 % (ref 43.0–77.0)
Platelets: 245 10*3/uL (ref 150.0–400.0)
RBC: 4.57 Mil/uL (ref 3.87–5.11)
RDW: 13.6 % (ref 11.5–15.5)
WBC: 7.1 10*3/uL (ref 4.0–10.5)

## 2021-11-29 LAB — COMPREHENSIVE METABOLIC PANEL
ALT: 19 U/L (ref 0–35)
AST: 17 U/L (ref 0–37)
Albumin: 4.4 g/dL (ref 3.5–5.2)
Alkaline Phosphatase: 68 U/L (ref 39–117)
BUN: 10 mg/dL (ref 6–23)
CO2: 26 mEq/L (ref 19–32)
Calcium: 10 mg/dL (ref 8.4–10.5)
Chloride: 101 mEq/L (ref 96–112)
Creatinine, Ser: 0.8 mg/dL (ref 0.40–1.20)
GFR: 82.73 mL/min (ref >60.00)
Glucose, Bld: 89 mg/dL (ref 70–99)
Potassium: 4 mEq/L (ref 3.5–5.1)
Sodium: 138 mEq/L (ref 135–145)
Total Bilirubin: 0.4 mg/dL (ref 0.2–1.2)
Total Protein: 7.4 g/dL (ref 6.0–8.3)

## 2021-11-29 LAB — MICROALBUMIN / CREATININE URINE RATIO
Creatinine,U: 143.4 mg/dL
Microalb Creat Ratio: 0.5 mg/g (ref 0.0–30.0)
Microalb, Ur: 0.8 mg/dL (ref 0.0–1.9)

## 2021-11-29 LAB — LIPID PANEL
Cholesterol: 136 mg/dL (ref 0–200)
HDL: 54.4 mg/dL (ref >39.00)
LDL Cholesterol: 63 mg/dL (ref 0–99)
NonHDL: 81.4
Total CHOL/HDL Ratio: 2
Triglycerides: 91 mg/dL (ref 0.0–149.0)
VLDL: 18.2 mg/dL (ref 0.0–40.0)

## 2021-11-29 LAB — HEMOGLOBIN A1C: Hgb A1c MFr Bld: 6.2 % (ref 4.6–6.5)

## 2021-11-30 ENCOUNTER — Other Ambulatory Visit: Payer: Self-pay | Admitting: Physician Assistant

## 2021-11-30 DIAGNOSIS — D492 Neoplasm of unspecified behavior of bone, soft tissue, and skin: Secondary | ICD-10-CM

## 2021-12-07 ENCOUNTER — Encounter: Payer: Self-pay | Admitting: Family Medicine

## 2021-12-07 ENCOUNTER — Ambulatory Visit: Payer: Self-pay

## 2021-12-07 ENCOUNTER — Ambulatory Visit: Payer: BC Managed Care – PPO | Admitting: Family Medicine

## 2021-12-07 VITALS — BP 130/86 | HR 72 | Ht 67.0 in | Wt 184.0 lb

## 2021-12-07 DIAGNOSIS — M898X1 Other specified disorders of bone, shoulder: Secondary | ICD-10-CM | POA: Diagnosis not present

## 2021-12-07 NOTE — Patient Instructions (Addendum)
Thank you for coming in today.   Please use Voltaren gel (Generic Diclofenac Gel) up to 4x daily for pain as needed.  This is available over-the-counter as both the name brand Voltaren gel and the generic diclofenac gel.   Heat or ice could help.   If not improving physical therapy would help.   If still not better we should do an injection.   Let me know how this goes.

## 2021-12-07 NOTE — Progress Notes (Unsigned)
I, Kelli Evans, LAT, ATC acting as a scribe for Kelli Leader, MD.  Subjective:    CC: R clavicle pain  HPI: Pt is a 56 y/o female c/o R clavicle pain ongoing since July. Pt notes a new lump has appeared over the past 2 weeks. Pt locates pain to R- sternal end of the clavicle and the 2nd is more in the middle 1/3 of the clavicle.  She notes a little bit of soreness in her [place] joint associated with some swelling.  Laying on her right side as noted below is mildly painful.  Her main concern is that this pain and swelling could represent something dangerous.  She is not that much bothered by pain currently.  Radiates: yes- into R trapz and neck Aggravates: R-side lying  Dx imaging: 11/29/21 Chest CT  11/08/21 [place] chest soft tissue   Pertinent review of Systems: No fevers or chills  Relevant historical information: Diabetes   Objective:    Vitals:   12/07/21 1524  BP: 130/86  Pulse: 72  SpO2: 99%   General: Well Developed, well nourished, and in no acute distress.   MSK: Right chest wall: Swelling at right [place] joint.  Mildly tender palpation in this region otherwise normal-appearing.  Lab and Radiology Results  Diagnostic Limited MSK Ultrasound of: Right [place] joint Mild to moderate effusion right [place] joint compared to left.  Otherwise normal-appearing clavicle right side. Impression: Right [place] joint effusion  CT Chest Wo Contrast  Result Date: 11/29/2021 CLINICAL DATA:  56 year old female palpable abnormality about the right sternoclavicular joint EXAM: CT CHEST WITHOUT CONTRAST TECHNIQUE: Multidetector CT imaging of the chest was performed following the standard protocol without IV contrast. RADIATION DOSE REDUCTION: This exam was performed according to the departmental dose-optimization program which includes automated exposure control, adjustment of the mA and/or kV according to patient size and/or use of iterative reconstruction technique. COMPARISON:  11/08/2021 FINDINGS:  Cardiovascular: No significant vascular findings. Normal heart size. No pericardial effusion. Mediastinum/Nodes: No enlarged mediastinal or axillary lymph nodes. Thyroid gland, trachea, and esophagus demonstrate no significant findings. Lungs/Pleura: Peri fissural 3 mm solid pulmonary nodule in the anterior right lower lobe. Subpleural 5 mm solid pulmonary nodule in the posterior right lower lobe. The lungs are otherwise clear bilaterally. Upper Abdomen: No acute abnormality. Diverticula in the visualized transverse colon. Musculoskeletal: Tiny subcortical lytic abnormality about the anterior aspect of the head of the right clavicle. Otherwise, the sternoclavicular joints are within normal limits and symmetric. No acute osseous abnormality. IMPRESSION: 1. Mild degenerative change about the right sternoclavicular joint. No abnormal surrounding soft tissue findings. 2. There are 2 subpleural, 5 mm less pulmonary nodules in the right lower lobe. No follow-up needed if patient is low-risk (and has no known or suspected primary neoplasm). Non-contrast chest CT can be considered in 12 months if patient is high-risk. This recommendation follows the consensus statement: Guidelines for Management of Incidental Pulmonary Nodules Detected on CT Images: From the Fleischner Society 2017; Radiology 2017; 284:228-243. Ruthann Cancer, MD [facility] Radiology Electronically Signed   By: Ruthann Cancer M.D.   On: 11/29/2021 13:04   [place] CHEST SOFT TISSUE  Result Date: 11/10/2021 CLINICAL DATA:  Raised palpable area at the sternoclavicular notch. EXAM: ULTRASOUND OF CHEST SOFT TISSUES TECHNIQUE: Ultrasound examination was performed in the area of clinical concern. COMPARISON:  None Available. FINDINGS: Scanning in the region of concern at the sternoclavicular joint on the right demonstrates a hypoechoic region containing possible  calcifications at the sternal clavicular joint measuring  0.8 x 0.5 cm. No focal fluid collection is identified. IMPRESSION: Soft tissue prominence at the sternoclavicular joint on the right with a hypoechoic region containing calcifications which is indeterminate. CT may be beneficial for further evaluation. Electronically Signed   By: Brett Fairy M.D.   On: 11/10/2021 04:36   MM 3D SCREEN BREAST BILATERAL  Result Date: 11/09/2021 CLINICAL DATA:  Screening. EXAM: DIGITAL SCREENING BILATERAL MAMMOGRAM WITH TOMOSYNTHESIS AND CAD TECHNIQUE: Bilateral screening digital craniocaudal and mediolateral oblique mammograms were obtained. Bilateral screening digital breast tomosynthesis was performed. The images were evaluated with computer-aided detection. COMPARISON:  Previous exam(s). ACR Breast Density Category b: There are scattered areas of fibroglandular density. FINDINGS: There are no findings suspicious for malignancy. IMPRESSION: No mammographic evidence of malignancy. A result letter of this screening mammogram will be mailed directly to the patient. RECOMMENDATION: Screening mammogram in one year. (Code:SM-B-01Y) BI-RADS CATEGORY  1: Negative. Electronically Signed   By: Lajean Manes M.D.   On: 11/09/2021 14:09    I, Kelli Evans, personally (independently) visualized and performed the interpretation of the CT and [place] images attached in this note.    Impression and Recommendations:    Assessment and Plan: 56 y.o. female with right chest wall swelling.  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  I spoke with radiology today prior to the visit and confirmed that the lytic changes seen on CT scan at the right [place] joint are nonconcerning and thought to be related to degenerative changes.  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  She is happy with a bit of reassurance.  If needed more treatment could be used.  For step would be Voltaren gel and Tylenol.  Next step would be physical therapy.   If still not better I would recommend a steroid injection at that time.  Check back as needed.  Reviewed treatment plan options and imaging and high detail. Total encounter time 30 minutes including face-to-face time with the patient and, reviewing past medical record, and charting on the date of service.   .  Time-based billing excludes time perform ultrasound.  PDMP not reviewed this encounter. Orders Placed This Encounter  Procedures   [place] LIMITED JOINT SPACE STRUCTURES UP RIGHT(NO LINKED CHARGES)    Order Specific Question:   Reason for Exam (SYMPTOM  OR DIAGNOSIS REQUIRED)    Answer:   right clavicle pain    Order Specific Question:   Preferred imaging location?    Answer:   [place]   No orders of the defined types were placed in this encounter.   Discussed warning signs or symptoms. Please see discharge instructions. Patient expresses understanding.   The above documentation has been reviewed and is accurate and complete Kelli Evans, M.D.

## 2021-12-18 ENCOUNTER — Encounter: Payer: Self-pay | Admitting: *Deleted

## 2022-01-01 ENCOUNTER — Other Ambulatory Visit: Payer: Self-pay | Admitting: Physician Assistant

## 2022-01-08 ENCOUNTER — Encounter: Payer: Self-pay | Admitting: Physician Assistant

## 2022-01-08 NOTE — Telephone Encounter (Signed)
Called [facility] spoke to Pharmacist, the Rx for Ozempic is on file with refills they are currently out of stock for Ozempic 2 mg injection.

## 2022-01-08 NOTE — Telephone Encounter (Signed)
Left message on voicemail to call office.  

## 2022-01-12 ENCOUNTER — Other Ambulatory Visit (HOSPITAL_BASED_OUTPATIENT_CLINIC_OR_DEPARTMENT_OTHER): Payer: Self-pay

## 2022-01-12 MED ORDER — OZEMPIC (2 MG/DOSE) 8 MG/3ML ~~LOC~~ SOPN
PEN_INJECTOR | SUBCUTANEOUS | 1 refills | Status: DC
  Filled 2022-01-12: qty 3, 28d supply, fill #0
  Filled 2022-02-05: qty 3, 28d supply, fill #1

## 2022-01-18 ENCOUNTER — Ambulatory Visit: Payer: BC Managed Care – PPO | Admitting: Family

## 2022-01-18 ENCOUNTER — Encounter: Payer: Self-pay | Admitting: Family

## 2022-01-18 VITALS — BP 150/93 | HR 91 | Temp 98.3°F | Ht 67.0 in | Wt 185.0 lb

## 2022-01-18 DIAGNOSIS — J209 Acute bronchitis, unspecified: Secondary | ICD-10-CM | POA: Diagnosis not present

## 2022-01-18 MED ORDER — ATROVENT HFA 17 MCG/ACT IN AERS: 2.0000 | INHALATION_SPRAY | Freq: Two times a day (BID) | RESPIRATORY_TRACT | 0 refills | Status: DC

## 2022-01-18 MED ORDER — PREDNISONE 10 MG PO TABS: ORAL_TABLET | ORAL | 0 refills | Status: DC

## 2022-01-18 NOTE — Progress Notes (Signed)
Patient ID: Kelli Evans, female    DOB: 18-Dec-1965, 56 y.o.   MRN: [id number]  Chief Complaint  Patient presents with   Cough    sx for over a month    HPI:      Persistent cough:  fever in Sept when sx started, then went away but cough persisted, Pt c/o cough, chest congestion and fever of 101.0 on Sunday, but no fever since then. Covid negative Sunday. Has tried dayquil/nyquil which does help so she can she sleep.       Assessment & Plan:  1. Acute bronchitis, unspecified organism - pt with a lot of postnasal mucus, sending Atrovent inhaler, advised to start OTC generic Xyxal or Claritin, take daily for 1 week, also sending pred dose pack, advised on use & SE of all meds. Drink at least 2L water qd.  - predniSONE (DELTASONE) 10 MG tablet; 6 tab day 1, 5 tab day 2-3, 4 tab day 4, 3 tab day 5  Dispense: 23 tablet; Refill: 0 - ipratropium (ATROVENT HFA) 17 MCG/ACT inhaler; Inhale 2 puffs into the lungs in the morning and at bedtime. Use for the next 5-7 days or until cough is better.  Dispense: 1 each; Refill: 0    Subjective:    Outpatient Medications Prior to Visit  Medication Sig Dispense Refill   Blood Glucose Monitoring Suppl (ONETOUCH VERIO) w/Device KIT Use to check blood sugar once a day.  Dx Code: E11.9 1 kit 0   glucose blood (ONETOUCH VERIO) test strip Use to check blood sugar once a day.  Dx Code: E11.9 100 each 1   Multiple Vitamins-Minerals (ONE-A-DAY 50 PLUS PO) Take by mouth daily.     omeprazole (PRILOSEC) 20 MG capsule TAKE 1 CAPSULE(20 MG) BY MOUTH DAILY 90 capsule 1   OneTouch Delica Lancets 27P MISC Use to check blood sugar once a day.  Dx Code: E11.9 100 each 1   rosuvastatin (CRESTOR) 40 MG tablet Take 1 tablet (40 mg total) by mouth daily. 90 tablet 3   Semaglutide, 2 MG/DOSE, (OZEMPIC, 2 MG/DOSE,) 8 MG/3ML SOPN INJECT 2 MG UNDER THE SKIN ONE DAY A WEEK 3 mL 1   venlafaxine XR (EFFEXOR-XR) 150 MG 24 hr capsule Take 1 capsule (150 mg total) by mouth daily  with breakfast. To be taken with 37.5 mg xr capsule. 90 capsule 1   venlafaxine XR (EFFEXOR-XR) 37.5 MG 24 hr capsule Take 1 capsule (37.5 mg total) by mouth daily with breakfast. Take 1 capsule with 150 mg XR capsule daily. 90 capsule 1   No facility-administered medications prior to visit.   Past Medical History:  Diagnosis Date   Allergy    Anemia    past hx of anemia 2019   Anxiety    Depression    Diabetes mellitus without complication (HCC)    GERD (gastroesophageal reflux disease)    Hyperlipidemia    no meds   Hypothyroidism    past hx    Past Surgical History:  Procedure Laterality Date   WISDOM TOOTH EXTRACTION     No Known Allergies    Objective:    Physical Exam Vitals and nursing note reviewed.  Constitutional:      Appearance: Normal appearance. She is ill-appearing.     Interventions: Face mask in place.  HENT:     Right Ear: Tympanic membrane and ear canal normal.     Left Ear: Tympanic membrane and ear canal normal.     Nose:  Right Sinus: No frontal sinus tenderness.     Left Sinus: No frontal sinus tenderness.     Mouth/Throat:     Mouth: Mucous membranes are moist.     Pharynx: Oropharyngeal exudate and posterior oropharyngeal erythema (mild) present. No pharyngeal swelling or uvula swelling.     Tonsils: No tonsillar exudate or tonsillar abscesses.  Cardiovascular:     Rate and Rhythm: Normal rate and regular rhythm.  Pulmonary:     Effort: Pulmonary effort is normal.     Breath sounds: Normal breath sounds.  Musculoskeletal:        General: Normal range of motion.  Lymphadenopathy:     Head:     Right side of head: No preauricular or posterior auricular adenopathy.     Left side of head: No preauricular or posterior auricular adenopathy.     Cervical: No cervical adenopathy.  Skin:    General: Skin is warm and dry.  Neurological:     Mental Status: She is alert.  Psychiatric:        Mood and Affect: Mood normal.        Behavior:  Behavior normal.    BP (!) 150/93 (BP Location: Left Arm, Patient Position: Sitting, Cuff Size: Large)   Pulse 91   Temp 98.3 F (36.8 C) (Temporal)   Ht 5' 7"  (1.702 m)   Wt 185 lb (83.9 kg)   LMP 08/09/2019 (Exact Date)   SpO2 96%   BMI 28.98 kg/m  Wt Readings from Last 3 Encounters:  01/18/22 185 lb (83.9 kg)  12/07/21 184 lb (83.5 kg)  11/06/21 186 lb 6.4 oz (84.6 kg)       Jeanie Sewer, NP

## 2022-01-18 NOTE — Patient Instructions (Addendum)
It was very nice to see you today!    I have sent over a steroid pack to start for your bronchitis. Take just 3 pills today (instead of the 6 on the bottle) so you can sleep tonight. Tomorrow take the normal day 2 dose of '50mg'$ , and then day 3 -5 as ordered.  Also start taking generic Xyzal or Claritin daily for the next week to help with your postnasal drip that may be worsening your cough.  I also have sent a generic Atrovent inhaler to help loosen the mucus in your chest to cough up easier. Use 2 puffs twice a day for next 5-7 days or until cough is improving.  Drink at least 2 liters of water daily to thin your mucus & overall feel better!       PLEASE NOTE:  If you had any lab tests please let us know if you have not heard back within a few days. You may see your results on MyChart before we have a chance to review them but we will give you a call once they are reviewed by [place]. If we ordered any referrals today, please let us know if you have not heard from their office within the next week.

## 2022-01-30 ENCOUNTER — Encounter: Payer: Self-pay | Admitting: Family

## 2022-01-30 DIAGNOSIS — J209 Acute bronchitis, unspecified: Secondary | ICD-10-CM

## 2022-01-30 MED ORDER — PREDNISONE 10 MG PO TABS: ORAL_TABLET | ORAL | 0 refills | Status: DC

## 2022-02-05 ENCOUNTER — Other Ambulatory Visit (HOSPITAL_BASED_OUTPATIENT_CLINIC_OR_DEPARTMENT_OTHER): Payer: Self-pay

## 2022-02-27 ENCOUNTER — Other Ambulatory Visit: Payer: Self-pay | Admitting: Physician Assistant

## 2022-03-08 ENCOUNTER — Encounter: Payer: Self-pay | Admitting: *Deleted

## 2022-03-14 ENCOUNTER — Other Ambulatory Visit: Payer: Self-pay | Admitting: Physician Assistant

## 2022-03-15 ENCOUNTER — Other Ambulatory Visit (HOSPITAL_BASED_OUTPATIENT_CLINIC_OR_DEPARTMENT_OTHER): Payer: Self-pay

## 2022-03-15 MED ORDER — OZEMPIC (2 MG/DOSE) 8 MG/3ML ~~LOC~~ SOPN
2.0000 mg | PEN_INJECTOR | SUBCUTANEOUS | 2 refills | Status: DC
  Filled 2022-03-15: qty 3, 28d supply, fill #0
  Filled 2022-04-19: qty 3, 28d supply, fill #1
  Filled 2022-05-14 – 2022-06-02 (×2): qty 3, 28d supply, fill #2

## 2022-04-20 ENCOUNTER — Other Ambulatory Visit (HOSPITAL_BASED_OUTPATIENT_CLINIC_OR_DEPARTMENT_OTHER): Payer: Self-pay

## 2022-05-21 ENCOUNTER — Other Ambulatory Visit (HOSPITAL_BASED_OUTPATIENT_CLINIC_OR_DEPARTMENT_OTHER): Payer: Self-pay

## 2022-06-02 ENCOUNTER — Other Ambulatory Visit (HOSPITAL_BASED_OUTPATIENT_CLINIC_OR_DEPARTMENT_OTHER): Payer: Self-pay

## 2022-06-05 ENCOUNTER — Other Ambulatory Visit: Payer: Self-pay | Admitting: Physician Assistant

## 2022-07-01 ENCOUNTER — Other Ambulatory Visit: Payer: Self-pay | Admitting: Physician Assistant

## 2022-07-02 ENCOUNTER — Other Ambulatory Visit (HOSPITAL_BASED_OUTPATIENT_CLINIC_OR_DEPARTMENT_OTHER): Payer: Self-pay

## 2022-07-02 MED ORDER — OZEMPIC (2 MG/DOSE) 8 MG/3ML ~~LOC~~ SOPN
2.0000 mg | PEN_INJECTOR | SUBCUTANEOUS | 0 refills | Status: DC
  Filled 2022-07-02: qty 3, 28d supply, fill #0

## 2022-08-05 ENCOUNTER — Other Ambulatory Visit: Payer: Self-pay | Admitting: Physician Assistant

## 2022-08-06 ENCOUNTER — Encounter (HOSPITAL_BASED_OUTPATIENT_CLINIC_OR_DEPARTMENT_OTHER): Payer: Self-pay | Admitting: Pharmacist

## 2022-08-06 ENCOUNTER — Other Ambulatory Visit (HOSPITAL_BASED_OUTPATIENT_CLINIC_OR_DEPARTMENT_OTHER): Payer: Self-pay

## 2022-08-08 ENCOUNTER — Encounter: Payer: Self-pay | Admitting: Physician Assistant

## 2022-08-09 ENCOUNTER — Other Ambulatory Visit (HOSPITAL_BASED_OUTPATIENT_CLINIC_OR_DEPARTMENT_OTHER): Payer: Self-pay

## 2022-08-09 ENCOUNTER — Other Ambulatory Visit: Payer: Self-pay

## 2022-08-09 MED ORDER — OZEMPIC (2 MG/DOSE) 8 MG/3ML ~~LOC~~ SOPN
2.0000 mg | PEN_INJECTOR | SUBCUTANEOUS | 0 refills | Status: DC
  Filled 2022-08-09 – 2022-08-18 (×2): qty 3, 28d supply, fill #0

## 2022-08-16 ENCOUNTER — Other Ambulatory Visit (HOSPITAL_BASED_OUTPATIENT_CLINIC_OR_DEPARTMENT_OTHER): Payer: Self-pay

## 2022-08-18 ENCOUNTER — Other Ambulatory Visit (HOSPITAL_BASED_OUTPATIENT_CLINIC_OR_DEPARTMENT_OTHER): Payer: Self-pay

## 2022-09-18 ENCOUNTER — Telehealth: Payer: Self-pay | Admitting: Physician Assistant

## 2022-09-18 DIAGNOSIS — E1169 Type 2 diabetes mellitus with other specified complication: Secondary | ICD-10-CM

## 2022-09-18 DIAGNOSIS — E119 Type 2 diabetes mellitus without complications: Secondary | ICD-10-CM

## 2022-09-18 NOTE — Telephone Encounter (Signed)
Pt has a physical appt in August and she states she needs orders for labs to be put in for fer A1c. Please advise.

## 2022-09-19 NOTE — Telephone Encounter (Signed)
Please call patient and schedule lab appointment only one week prior to CPE, tell pt to fast also will need to give urine.

## 2022-09-25 ENCOUNTER — Encounter: Payer: Self-pay | Admitting: Physician Assistant

## 2022-09-25 ENCOUNTER — Other Ambulatory Visit: Payer: Self-pay | Admitting: Physician Assistant

## 2022-09-25 DIAGNOSIS — Z1231 Encounter for screening mammogram for malignant neoplasm of breast: Secondary | ICD-10-CM

## 2022-09-25 MED ORDER — OZEMPIC (2 MG/DOSE) 8 MG/3ML ~~LOC~~ SOPN: 2.0000 mg | PEN_INJECTOR | SUBCUTANEOUS | 0 refills | Status: DC

## 2022-10-05 ENCOUNTER — Other Ambulatory Visit (HOSPITAL_BASED_OUTPATIENT_CLINIC_OR_DEPARTMENT_OTHER): Payer: Self-pay

## 2022-10-05 ENCOUNTER — Encounter: Payer: Self-pay | Admitting: Physician Assistant

## 2022-10-05 ENCOUNTER — Ambulatory Visit: Payer: BC Managed Care – PPO | Admitting: Physician Assistant

## 2022-10-05 VITALS — BP 120/80 | HR 66 | Temp 98.0°F | Ht 67.0 in | Wt 186.2 lb

## 2022-10-05 DIAGNOSIS — F418 Other specified anxiety disorders: Secondary | ICD-10-CM

## 2022-10-05 DIAGNOSIS — Z7985 Long-term (current) use of injectable non-insulin antidiabetic drugs: Secondary | ICD-10-CM

## 2022-10-05 DIAGNOSIS — K219 Gastro-esophageal reflux disease without esophagitis: Secondary | ICD-10-CM | POA: Diagnosis not present

## 2022-10-05 DIAGNOSIS — E1169 Type 2 diabetes mellitus with other specified complication: Secondary | ICD-10-CM | POA: Diagnosis not present

## 2022-10-05 DIAGNOSIS — S0990XA Unspecified injury of head, initial encounter: Secondary | ICD-10-CM

## 2022-10-05 DIAGNOSIS — E785 Hyperlipidemia, unspecified: Secondary | ICD-10-CM | POA: Diagnosis not present

## 2022-10-05 LAB — LIPID PANEL
Cholesterol: 154 mg/dL (ref 0–200)
HDL: 59.3 mg/dL (ref >39.00)
LDL Cholesterol: 81 mg/dL (ref 0–99)
NonHDL: 94.77
Total CHOL/HDL Ratio: 3
Triglycerides: 71 mg/dL (ref 0.0–149.0)
VLDL: 14.2 mg/dL (ref 0.0–40.0)

## 2022-10-05 LAB — COMPREHENSIVE METABOLIC PANEL
ALT: 22 U/L (ref 0–35)
AST: 15 U/L (ref 0–37)
Albumin: 4.3 g/dL (ref 3.5–5.2)
Alkaline Phosphatase: 63 U/L (ref 39–117)
BUN: 11 mg/dL (ref 6–23)
CO2: 30 mEq/L (ref 19–32)
Calcium: 9.6 mg/dL (ref 8.4–10.5)
Chloride: 105 mEq/L (ref 96–112)
Creatinine, Ser: 0.8 mg/dL (ref 0.40–1.20)
GFR: 82.24 mL/min (ref >60.00)
Glucose, Bld: 93 mg/dL (ref 70–99)
Potassium: 4.4 mEq/L (ref 3.5–5.1)
Sodium: 140 mEq/L (ref 135–145)
Total Bilirubin: 0.6 mg/dL (ref 0.2–1.2)
Total Protein: 6.7 g/dL (ref 6.0–8.3)

## 2022-10-05 LAB — CBC WITH DIFFERENTIAL/PLATELET
Basophils Absolute: 0.1 10*3/uL (ref 0.0–0.1)
Basophils Relative: 1.1 % (ref 0.0–3.0)
Eosinophils Absolute: 0 10*3/uL (ref 0.0–0.7)
Eosinophils Relative: 1 % (ref 0.0–5.0)
HCT: 39.6 % (ref 36.0–46.0)
Hemoglobin: 13.1 g/dL (ref 12.0–15.0)
Lymphocytes Relative: 28.4 % (ref 12.0–46.0)
Lymphs Abs: 1.4 10*3/uL (ref 0.7–4.0)
MCHC: 33 g/dL (ref 30.0–36.0)
MCV: 84.4 fl (ref 78.0–100.0)
Monocytes Absolute: 0.3 10*3/uL (ref 0.1–1.0)
Monocytes Relative: 6.9 % (ref 3.0–12.0)
Neutro Abs: 3.1 10*3/uL (ref 1.4–7.7)
Neutrophils Relative %: 62.6 % (ref 43.0–77.0)
Platelets: 231 10*3/uL (ref 150.0–400.0)
RBC: 4.69 Mil/uL (ref 3.87–5.11)
RDW: 14.3 % (ref 11.5–15.5)
WBC: 4.9 10*3/uL (ref 4.0–10.5)

## 2022-10-05 LAB — MICROALBUMIN / CREATININE URINE RATIO
Creatinine,U: 176.9 mg/dL
Microalb Creat Ratio: 0.4 mg/g (ref 0.0–30.0)
Microalb, Ur: <0.7 mg/dL (ref 0.0–1.9)

## 2022-10-05 LAB — HEMOGLOBIN A1C: Hgb A1c MFr Bld: 6 % (ref 4.6–6.5)

## 2022-10-05 MED ORDER — OMEPRAZOLE 20 MG PO CPDR
20.0000 mg | DELAYED_RELEASE_CAPSULE | Freq: Every day | ORAL | 1 refills | Status: DC
Start: 2022-10-05 — End: 2023-12-24
  Filled 2022-10-05 – 2022-11-15 (×2): qty 90, 90d supply, fill #0

## 2022-10-05 MED ORDER — ONETOUCH VERIO W/DEVICE KIT
PACK | 0 refills | Status: AC
  Filled 2022-10-05: qty 1, 90d supply, fill #0

## 2022-10-05 MED ORDER — TIRZEPATIDE 5 MG/0.5ML ~~LOC~~ SOAJ
5.0000 mg | SUBCUTANEOUS | 0 refills | Status: DC
  Filled 2022-10-05: qty 2, 28d supply, fill #0

## 2022-10-05 NOTE — Patient Instructions (Signed)
It was great to see you!  Refill glucometer today  Avoid excessive screens/loud noises/bright lights  If worsening headache(s) or other symptoms, let us know  WE will try to send in [facility] again and see what happens!  Take care,  Jarold Motto PA-C

## 2022-10-05 NOTE — Progress Notes (Signed)
Kelli Evans is a 57 y.o. female here for a follow up of a pre-existing problem.  History of Present Illness:   Chief Complaint  Patient presents with   Medical Management of Chronic Issues    Diabetes, Anxiety & Depression    HPI  Diabetes: Reports she was monitoring blood glucose weekly until OneTouch Monitor died in 2022-10-04. Notes that her blood sugar levels have been stable and doesn't feel like she needs to continue using monitor. Compliant with 2mg /dose Ozempic. Would like to trial [facility] because she doesn't have cravings or as much "food noise." Reports her diet could be better, tries to meal prep when she can, and is more aware when she hasn't eaten well.  States she walks her dog to exercise, but not as much currently due to the summer temperatures.  Lab Results  Component Value Date   HGBA1C 6.2 11/29/2021   Head Trauma: Reports she fell this morning after tripping over her dog, hitting her head on her laptop. Notes she does have a headache during this visit due to the trauma. Denies vision changes, light or sound sensitivity, weakness in legs. Has not taken anything for symptoms.  Depression: Compliant with 187.5 mg Effexor. Tolerating well. Denies SI/HI   GERD: Compliant with 20 mg Omeprazole. Tolerating well.  Would like refill.  Past Medical History:  Diagnosis Date   Allergy    Anemia    past hx of anemia 2019   Anxiety    Depression    Diabetes mellitus without complication (HCC)    GERD (gastroesophageal reflux disease)    Hyperlipidemia    no meds   Hypothyroidism    past hx      Social History   Tobacco Use   Smoking status: Never   Smokeless tobacco: Never  Substance Use Topics   Alcohol use: Yes    Alcohol/week: 2.0 standard drinks of alcohol    Types: 1 Glasses of wine, 1 Standard drinks or equivalent per week    Comment: once a month    Drug use: No    Past Surgical History:  Procedure Laterality Date   WISDOM TOOTH EXTRACTION       Family History  Problem Relation Age of Onset   Breast cancer Mother    Cancer Mother 57       breast   Colon polyps Mother    Hypertension Father    Heart disease Father        chf, pacemaker   Cancer Father        brain   Hyperlipidemia Father    Hypothyroidism Father    Brain cancer Father    Colon polyps Father    Arthritis Father    Hearing loss Father    Cancer Cousin 32       breast    Other Sister        tamoxifen x 3 yrs for a pre cancereous  issue   Breast cancer Maternal Grandfather    Cancer Maternal Grandfather    Cancer Maternal Grandmother    Colon cancer Neg Hx    Esophageal cancer Neg Hx    Rectal cancer Neg Hx    Stomach cancer Neg Hx     No Known Allergies  Current Medications:   Current Outpatient Medications:    Blood Glucose Monitoring Suppl (ONETOUCH VERIO) w/Device KIT, Use to check blood sugar once a day.  Dx Code: E11.9, Disp: 1 kit, Rfl: 0   glucose blood (ONETOUCH  VERIO) test strip, Use to check blood sugar once a day.  Dx Code: E11.9, Disp: 100 each, Rfl: 1   ipratropium (ATROVENT HFA) 17 MCG/ACT inhaler, Inhale 2 puffs into the lungs in the morning and at bedtime. Use for the next 5-7 days or until cough is better., Disp: 1 each, Rfl: 0   Multiple Vitamins-Minerals (ONE-A-DAY 50 PLUS PO), Take by mouth daily., Disp: , Rfl:    omeprazole (PRILOSEC) 20 MG capsule, TAKE 1 CAPSULE(20 MG) BY MOUTH DAILY, Disp: 90 capsule, Rfl: 1   OneTouch Delica Lancets 33G MISC, Use to check blood sugar once a day.  Dx Code: E11.9, Disp: 100 each, Rfl: 1   rosuvastatin (CRESTOR) 40 MG tablet, Take 1 tablet (40 mg total) by mouth daily., Disp: 90 tablet, Rfl: 3   Semaglutide, 2 MG/DOSE, (OZEMPIC, 2 MG/DOSE,) 8 MG/3ML SOPN, Inject 2 mg into the skin once a week., Disp: 3 mL, Rfl: 0   venlafaxine XR (EFFEXOR-XR) 150 MG 24 hr capsule, TAKE 1 CAPSULE BY MOUTH EVERY DAY WITH BREAKFAST- TO BE TAKEN WITH 37.5 MG XR CAPSULE, Disp: 30 capsule, Rfl: 0   venlafaxine XR  (EFFEXOR-XR) 37.5 MG 24 hr capsule, TAKE ONE CAPSULE BY MOUTH DAILY WITH BREAKFAST AND 150 MG XR CAPSULE, Disp: 90 capsule, Rfl: 1   Review of Systems:   Review of Systems  Neurological:  Headaches: Caused by head trauma.    Vitals:   Vitals:   10/05/22 0842  BP: 120/80  Pulse: 66  Temp: 98 F (36.7 C)  TempSrc: Temporal  SpO2: 97%  Weight: 186 lb 4 oz (84.5 kg)  Height: 5\' 7"  (1.702 m)     Body mass index is 29.17 kg/m.  Physical Exam:   Physical Exam Vitals and nursing note reviewed.  Constitutional:      General: She is not in acute distress.    Appearance: She is well-developed. She is not ill-appearing or toxic-appearing.  HENT:     Head: Normocephalic and atraumatic.      Right Ear: Tympanic membrane, ear canal and external ear normal. Tympanic membrane is not erythematous, retracted or bulging.     Left Ear: Tympanic membrane, ear canal and external ear normal. Tympanic membrane is not erythematous, retracted or bulging.     Nose: Nose normal.     Right Sinus: No maxillary sinus tenderness or frontal sinus tenderness.     Left Sinus: No maxillary sinus tenderness or frontal sinus tenderness.     Mouth/Throat:     Pharynx: Uvula midline. No posterior oropharyngeal erythema.  Eyes:     General: Lids are normal.     Conjunctiva/sclera: Conjunctivae normal.  Neck:     Trachea: Trachea normal.  Cardiovascular:     Rate and Rhythm: Normal rate and regular rhythm.     Heart sounds: Normal heart sounds, S1 normal and S2 normal.  Pulmonary:     Effort: Pulmonary effort is normal.     Breath sounds: Normal breath sounds. No decreased breath sounds, wheezing, rhonchi or rales.  Lymphadenopathy:     Cervical: No cervical adenopathy.  Skin:    General: Skin is warm and dry.  Neurological:     General: No focal deficit present.     Mental Status: She is alert.     Cranial Nerves: Cranial nerves 2-12 are intact.     Sensory: Sensation is intact.     Motor: Motor  function is intact.     Coordination: Coordination is intact.  Gait: Gait is intact.  Psychiatric:        Speech: Speech normal.        Behavior: Behavior normal. Behavior is cooperative.     Assessment and Plan:   Long-term current use of injectable noninsulin antidiabetic medication Update A1c today Will try to transition to [facility] to help reduce food noise for patient She is on max Ozempic and her weight has stalled Also will re-order her glucometer, however discussed that she does not need to check this on a regular basis in my medical opinion  Follow-up in 3-6 month(s) based on Hemoglobin A1c results  Hyperlipidemia associated with type 2 diabetes mellitus (HCC) Update lipid panel and adjust crestor 40 mg daily  Depression with anxiety Well controlled with 187.5 mg Effexor XR Denies SI/HI  Gastroesophageal reflux disease Well controlled with Prilosec 20 mg daily  Injury of head, initial encounter Neuro exam wnl [place] Head Injury score is 0 Discussed recommendations to avoid stimulation to screens and noise If symptom(s) worsen or persist, reach out for next steps If sudden worsening of any symptom(s), needs to go to the ER     I,Emily Lagle,acting as a scribe for [employer], PA.,have documented all relevant documentation on the behalf of Jarold Motto, PA,as directed by  Jarold Motto, PA while in the presence of Jarold Motto, [place].  I, Jarold Motto, [place], have reviewed all documentation for this visit. The documentation on 10/05/22 for the exam, diagnosis, procedures, and orders are all accurate and complete.   Jarold Motto, PA-C

## 2022-10-08 ENCOUNTER — Other Ambulatory Visit (HOSPITAL_BASED_OUTPATIENT_CLINIC_OR_DEPARTMENT_OTHER): Payer: Self-pay

## 2022-10-10 ENCOUNTER — Other Ambulatory Visit (HOSPITAL_BASED_OUTPATIENT_CLINIC_OR_DEPARTMENT_OTHER): Payer: Self-pay

## 2022-10-24 ENCOUNTER — Other Ambulatory Visit: Payer: Self-pay | Admitting: Physician Assistant

## 2022-10-24 ENCOUNTER — Encounter (INDEPENDENT_AMBULATORY_CARE_PROVIDER_SITE_OTHER): Payer: Self-pay

## 2022-10-24 MED ORDER — VENLAFAXINE HCL ER 150 MG PO CP24: 150.0000 mg | ORAL_CAPSULE | Freq: Every day | ORAL | 0 refills | Status: DC

## 2022-11-05 ENCOUNTER — Encounter: Payer: Self-pay | Admitting: Physician Assistant

## 2022-11-07 NOTE — Progress Notes (Signed)
Subjective:    Kelli Evans is a 57 y.o. female and is here for a comprehensive physical exam.  HPI  There are no preventive care reminders to display for this patient.   Acute Concerns: None  Chronic Issues: Ear Discomfort Has had feeling of fluid in ears for some time.  She is not taking antihistamines. Has some itching in ears.  Anxiety and Depression Stable with Effexor-XR 150 mg with breakfast. Denies SI/HI.  Type 2 Diabetes Mellitus Treated with Mounjaro 5 mg weekly. She has been taking this for more than one month and has not had any negative side effects. HGBA1c at 6.0% on 10/05/22, down from 6.2% on 11/29/21. She has gained 4 pounds between 01/18/22 and today.  Right Foot Pain; Lower Extremity Muscle Spasms Has been having sharp, stabbing pain in right foot, leg muscle spasms at night for some time. This causes her to wake up from sleep. These episodes happen 1-2 times weekly. No known injuries to right foot. Denies numbness in feet. Taking multivitamin and no other supplements.  Hypothyroidism She has not taken medication for around 20 years. TSH at 3.31 on 04/26/21.  Hyperlipidemia Treated with rosuvastatin 40 mg daily. Lipid panel normal 10/05/22. History of right bundle branch block.  CMET and CBC normal. Denies unusual headaches, GI symptoms. She has had mild swelling in ankles.  Health Maintenance: Immunizations -- UTD on tetanus vaccine. Colonoscopy -- Last completed 10/13/18. Showed diverticulosis in sigmoid colon, one diminutive polyp in rectum. Pathology showed hyperplastic polyp.  Mammogram -- Last completed 11/08/21. No mammographic evidence of malignancy. Recommended repeat in 2024. PAP -- Last completed 09/25/19. Negative for intraepithelial lesion or malignancy. Recommended repeat in 2024. Due - declines to next year. Bone Density -- N/A Diet -- Healthy Exercise -- She is doing aerobics classes through the city of [place].  Sleep  habits -- Stable though somewhat disturbed due to nerve pain, muscle spasms. Mood -- Stable  UTD with dentist? - Yes UTD with eye doctor? - Yes  Weight history: Wt Readings from Last 10 Encounters:  11/14/22 189 lb 4 oz (85.8 kg)  10/05/22 186 lb 4 oz (84.5 kg)  01/18/22 185 lb (83.9 kg)  12/07/21 184 lb (83.5 kg)  11/06/21 186 lb 6.4 oz (84.6 kg)  08/23/21 189 lb 4 oz (85.8 kg)  04/26/21 202 lb 4 oz (91.7 kg)  09/29/20 202 lb 9.6 oz (91.9 kg)  10/26/19 195 lb (88.5 kg)  09/25/19 187 lb 9.6 oz (85.1 kg)   Body mass index is 29.64 kg/m. Patient's last menstrual period was 08/09/2019 (exact date).  Alcohol use:  reports current alcohol use of about 2.0 standard drinks of alcohol per week.  Tobacco use:  Tobacco Use: Low Risk  (11/14/2022)   Patient History    Smoking Tobacco Use: Never    Smokeless Tobacco Use: Never    Passive Exposure: Not on file   Eligible for lung cancer screening? No     11/14/2022    2:09 PM  Depression screen PHQ 2/9  Decreased Interest 1  Down, Depressed, Hopeless 1  PHQ - 2 Score 2  Altered sleeping 3  Tired, decreased energy 2  Change in appetite 2  Feeling bad or failure about yourself  1  Trouble concentrating 2  Moving slowly or fidgety/restless 0  Suicidal thoughts 0  PHQ-9 Score 12  Difficult doing work/chores Not difficult at all     Other providers/specialists: Patient Care Team: Jarold Motto, [place] as PCP -  General (Physician Assistant) Bufford Buttner, MD as Referring Physician (Dermatology)    PMHx, SurgHx, SocialHx, Medications, and Allergies were reviewed in the Visit Navigator and updated as appropriate.   Past Medical History:  Diagnosis Date   Allergy    Anemia    past hx of anemia 2019   Anxiety    Depression    Diabetes mellitus without complication (HCC)    GERD (gastroesophageal reflux disease)    Heart murmur 1996   I have a right bundle branch blockage   Hyperlipidemia    no meds    Hypothyroidism    past hx      Past Surgical History:  Procedure Laterality Date   WISDOM TOOTH EXTRACTION       Family History  Problem Relation Age of Onset   Breast cancer Mother    Cancer Mother 56       breast   Colon polyps Mother    Hypertension Father    Heart disease Father        chf, pacemaker   Cancer Father        brain   Hyperlipidemia Father    Hypothyroidism Father    Brain cancer Father    Colon polyps Father    Arthritis Father    Hearing loss Father    Cancer Cousin 33       breast    Other Sister        tamoxifen x 3 yrs for a pre cancereous  issue   Breast cancer Maternal Grandfather    Cancer Maternal Grandfather    Cancer Maternal Grandmother    Colon cancer Neg Hx    Esophageal cancer Neg Hx    Rectal cancer Neg Hx    Stomach cancer Neg Hx     Social History   Tobacco Use   Smoking status: Never   Smokeless tobacco: Never  Substance Use Topics   Alcohol use: Yes    Alcohol/week: 2.0 standard drinks of alcohol    Types: 1 Glasses of wine, 1 Standard drinks or equivalent per week    Comment: once a month    Drug use: No    Review of Systems:   Review of Systems  Constitutional:  Negative for chills, fever, malaise/fatigue and weight loss.  HENT:  Positive for ear pain (Fluid). Negative for congestion, hearing loss, sinus pain and sore throat.        (+) Inner ear itching  Eyes:  Negative for blurred vision.  Respiratory:  Negative for cough, hemoptysis and shortness of breath.   Cardiovascular:  Negative for chest pain, palpitations, leg swelling and PND.  Gastrointestinal:  Negative for abdominal pain, constipation, diarrhea, heartburn, nausea and vomiting.  Genitourinary:  Negative for dysuria, frequency and urgency.  Musculoskeletal:  Negative for back pain, myalgias and neck pain.       (+) Pain right foot  Skin:  Negative for itching and rash.  Neurological:  Negative for dizziness, tingling, seizures, loss of  consciousness and headaches.  Endo/Heme/Allergies:  Negative for polydipsia.  Psychiatric/Behavioral:  Negative for depression. The patient is not nervous/anxious.     Objective:   BP 130/86 (BP Location: Left Arm, Patient Position: Sitting, Cuff Size: Normal)   Pulse 75   Temp (!) 97.5 F (36.4 C) (Temporal)   Ht 5\' 7"  (1.702 m)   Wt 189 lb 4 oz (85.8 kg)   LMP 08/09/2019 (Exact Date)   SpO2 98%   BMI 29.64 kg/m  Body  mass index is 29.64 kg/m.   General Appearance:    Alert, cooperative, no distress, appears stated age  Head:    Normocephalic, without obvious abnormality, atraumatic  Eyes:    PERRL, conjunctiva/corneas clear, EOM's intact, fundi    benign, both eyes  Ears:    Normal TM's and external ear canals, both ears  Nose:   Nares normal, septum midline, mucosa normal, no drainage    or sinus tenderness  Throat:   Lips, mucosa, and tongue normal; teeth and gums normal  Neck:   Supple, symmetrical, trachea midline, no adenopathy;    thyroid:  no enlargement/tenderness/nodules; no carotid   bruit or JVD  Back:     Symmetric, no curvature, ROM normal, no CVA tenderness  Lungs:     Clear to auscultation bilaterally, respirations unlabored  Chest Wall:    No tenderness or deformity   Heart:    Regular rate and rhythm, S1 and S2 normal, no murmur, rub or gallop  Breast Exam:    Deferred   Abdomen:     Soft, non-tender, bowel sounds active all four quadrants,    no masses, no organomegaly  Genitalia:    Deferred   Extremities:   Extremities normal, atraumatic, no cyanosis or edema  Pulses:   2+ and symmetric all extremities  Skin:   Skin color, texture, turgor normal, no rashes or lesions  Lymph nodes:   Cervical, supraclavicular, and axillary nodes normal  Neurologic:   CNII-XII intact, normal strength, sensation and reflexes    throughout    Assessment/Plan:   Encounter for general adult medical examination with abnormal findings Today patient counseled on age  appropriate routine health concerns for screening and prevention, each reviewed and up to date or declined. Immunizations reviewed and up to date or declined. Labs ordered and reviewed. Risk factors for depression reviewed and negative. Hearing function and visual acuity are intact. ADLs screened and addressed as needed. Functional ability and level of safety reviewed and appropriate. Education, counseling and referrals performed based on assessed risks today. Patient provided with a copy of personalized plan for preventive services.  Ear itch; Dysfunction of right eustachian tube No red flags Trial oral antihistamine Consider nasal steroid if no improvement  Right bundle branch block Reviewed that this does not require yearly monitoring  Denies any symptom(s) of acute coronary syndrome  Continue to monitor  Muscle pain Unclear etiology Will update blood work to rule out possible cause of symptom(s) May consider holding statin x 2 weeks to see if this makes a difference vs switch to lower potency statin Likely refer to sports medicine if ongoing  Hypothyroidism, unspecified type Update TSH and provide recommendations  Overweight Continue healthy lifestyle efforts  Diabetes mellitus without complication (HCC); Long-term current use of injectable noninsulin antidiabetic medication No red flags Increase Mounjaro as able - will send in next dosages for her Follow  up in 6 month(s), sooner if concerns  Hyperlipidemia, unspecified hyperlipidemia type Well controlled however will trial holding statin to rule out possible myalgias with this Consider low potency statin if needed    I,Alexander Ruley,acting as a scribe for [employer], PA.,have documented all relevant documentation on the behalf of Jarold Motto, PA,as directed by  Jarold Motto, PA while in the presence of Jarold Motto, [place].   I, Jarold Motto, [place], have reviewed all documentation for this visit. The  documentation on 11/14/22 for the exam, diagnosis, procedures, and orders are all accurate and complete.    Lelon Mast  Bufford Buttner, PA-C [facility] [place] [facility]

## 2022-11-13 ENCOUNTER — Ambulatory Visit
Admission: RE | Admit: 2022-11-13 | Discharge: 2022-11-13 | Disposition: A | Discharge status: Home / Self Care | Payer: BC Managed Care – PPO | Source: Ambulatory Visit | Attending: Physician Assistant | Admitting: Physician Assistant

## 2022-11-13 DIAGNOSIS — Z1231 Encounter for screening mammogram for malignant neoplasm of breast: Secondary | ICD-10-CM

## 2022-11-14 ENCOUNTER — Ambulatory Visit (INDEPENDENT_AMBULATORY_CARE_PROVIDER_SITE_OTHER): Payer: BC Managed Care – PPO | Admitting: Physician Assistant

## 2022-11-14 ENCOUNTER — Other Ambulatory Visit (HOSPITAL_BASED_OUTPATIENT_CLINIC_OR_DEPARTMENT_OTHER): Payer: Self-pay

## 2022-11-14 ENCOUNTER — Encounter: Payer: Self-pay | Admitting: Physician Assistant

## 2022-11-14 VITALS — BP 130/86 | HR 75 | Temp 97.5°F | Ht 67.0 in | Wt 189.2 lb

## 2022-11-14 DIAGNOSIS — M791 Myalgia, unspecified site: Secondary | ICD-10-CM

## 2022-11-14 DIAGNOSIS — L299 Pruritus, unspecified: Secondary | ICD-10-CM | POA: Diagnosis not present

## 2022-11-14 DIAGNOSIS — I4510 Unspecified right bundle-branch block: Secondary | ICD-10-CM | POA: Diagnosis not present

## 2022-11-14 DIAGNOSIS — E785 Hyperlipidemia, unspecified: Secondary | ICD-10-CM

## 2022-11-14 DIAGNOSIS — E119 Type 2 diabetes mellitus without complications: Secondary | ICD-10-CM

## 2022-11-14 DIAGNOSIS — Z0001 Encounter for general adult medical examination with abnormal findings: Secondary | ICD-10-CM

## 2022-11-14 DIAGNOSIS — E039 Hypothyroidism, unspecified: Secondary | ICD-10-CM | POA: Diagnosis not present

## 2022-11-14 DIAGNOSIS — Z7985 Long-term (current) use of injectable non-insulin antidiabetic drugs: Secondary | ICD-10-CM

## 2022-11-14 DIAGNOSIS — E663 Overweight: Secondary | ICD-10-CM

## 2022-11-14 DIAGNOSIS — H6991 Unspecified Eustachian tube disorder, right ear: Secondary | ICD-10-CM

## 2022-11-14 MED ORDER — TIRZEPATIDE 15 MG/0.5ML ~~LOC~~ SOAJ
15.0000 mg | SUBCUTANEOUS | 1 refills | Status: AC
Start: 2023-02-12 — End: ?
  Filled 2022-11-14: qty 6, 84d supply, fill #0
  Filled 2023-02-28: qty 2, 28d supply, fill #0
  Filled 2023-03-25: qty 2, 28d supply, fill #1
  Filled 2023-04-22 – 2023-05-04 (×2): qty 2, 28d supply, fill #2
  Filled 2023-06-11: qty 2, 28d supply, fill #3
  Filled 2023-07-23: qty 2, 28d supply, fill #4

## 2022-11-14 MED ORDER — TIRZEPATIDE 7.5 MG/0.5ML ~~LOC~~ SOAJ
7.5000 mg | SUBCUTANEOUS | 0 refills | Status: DC
  Filled 2022-11-14: qty 2, 28d supply, fill #0

## 2022-11-14 MED ORDER — TIRZEPATIDE 10 MG/0.5ML ~~LOC~~ SOPN
10.0000 mg | PEN_INJECTOR | SUBCUTANEOUS | 0 refills | Status: AC
Start: 2022-12-14 — End: ?
  Filled 2022-11-14 – 2022-12-14 (×2): qty 2, 28d supply, fill #0

## 2022-11-14 MED ORDER — TIRZEPATIDE 12.5 MG/0.5ML ~~LOC~~ SOAJ
12.5000 mg | SUBCUTANEOUS | 0 refills | Status: DC
  Filled 2022-11-14 – 2023-01-25 (×2): qty 2, 28d supply, fill #0

## 2022-11-14 NOTE — Patient Instructions (Addendum)
It was great to see you!  Start regular use of over the counter antihistamines such as Zyrtec (cetirizine), Claritin (loratadine), Allegra (fexofenadine), or Xyzal (levocetirizine) daily as needed. Avoid D-products such as Claritin-D.  I will send in future dosages of Mounjaro for you.  PAP is due -- we will do next year  I'll be in touch with lab results with a plan for your statin (if we are going to hold)  Please go to the lab for blood work.   Our office will call you with your results unless you have chosen to receive results via MyChart.  If your blood work is normal we will follow-up each year for physicals and as scheduled for chronic medical problems.  If anything is abnormal we will treat accordingly and get you in for a follow-up.  Take care,  Lelon Mast

## 2022-11-15 LAB — CK: Total CK: 66 U/L (ref 7–177)

## 2022-11-16 ENCOUNTER — Other Ambulatory Visit (HOSPITAL_BASED_OUTPATIENT_CLINIC_OR_DEPARTMENT_OTHER): Payer: Self-pay

## 2022-11-16 LAB — TSH: TSH: 3.58 u[IU]/mL (ref 0.35–5.50)

## 2022-11-19 ENCOUNTER — Other Ambulatory Visit (HOSPITAL_BASED_OUTPATIENT_CLINIC_OR_DEPARTMENT_OTHER): Payer: Self-pay

## 2022-11-30 ENCOUNTER — Other Ambulatory Visit (HOSPITAL_BASED_OUTPATIENT_CLINIC_OR_DEPARTMENT_OTHER): Payer: Self-pay

## 2022-12-01 ENCOUNTER — Other Ambulatory Visit: Payer: Self-pay | Admitting: Physician Assistant

## 2022-12-14 ENCOUNTER — Encounter (HOSPITAL_BASED_OUTPATIENT_CLINIC_OR_DEPARTMENT_OTHER): Payer: Self-pay

## 2022-12-14 ENCOUNTER — Other Ambulatory Visit (HOSPITAL_BASED_OUTPATIENT_CLINIC_OR_DEPARTMENT_OTHER): Payer: Self-pay

## 2022-12-18 ENCOUNTER — Encounter: Payer: Self-pay | Admitting: Physician Assistant

## 2022-12-19 ENCOUNTER — Other Ambulatory Visit (HOSPITAL_BASED_OUTPATIENT_CLINIC_OR_DEPARTMENT_OTHER): Payer: Self-pay

## 2022-12-21 ENCOUNTER — Other Ambulatory Visit (HOSPITAL_COMMUNITY): Payer: Self-pay

## 2022-12-21 ENCOUNTER — Telehealth: Payer: Self-pay | Admitting: *Deleted

## 2022-12-21 ENCOUNTER — Telehealth: Payer: Self-pay

## 2022-12-21 NOTE — Telephone Encounter (Signed)
Please do PA for Mounjaro 7.5 mg

## 2022-12-21 NOTE — Telephone Encounter (Signed)
Pharmacy Patient Advocate Encounter   Received notification from Pt Calls Messages that prior authorization for Mounjaro 7.5mg /0.10ml is required/requested.   Insurance verification completed.   The patient is insured through [facility] .   Per test claim: PA required; PA submitted to [facility] via CoverMyMeds Key/confirmation #/[id number] Status is pending

## 2022-12-24 NOTE — Telephone Encounter (Signed)
Pharmacy Patient Advocate Encounter  Received notification from [facility] that Prior Authorization for Greggory Keen has been DENIED.  Full denial letter will be uploaded to the media tab. See denial reason below.   PA #/Case ID/Reference #:  [id number]

## 2022-12-25 NOTE — Telephone Encounter (Signed)
See Samantha's message. Please re submit PA for [facility] and send office notes along with A1c lab results. Thank you

## 2022-12-25 NOTE — Telephone Encounter (Signed)
Prior authorization for Kelli Evans has been denied. Please advise.

## 2022-12-25 NOTE — Telephone Encounter (Signed)
Resubmitted prior authorization for [facility] to [facility] via fax at [phone].

## 2022-12-26 ENCOUNTER — Other Ambulatory Visit (HOSPITAL_BASED_OUTPATIENT_CLINIC_OR_DEPARTMENT_OTHER): Payer: Self-pay

## 2022-12-26 ENCOUNTER — Other Ambulatory Visit (HOSPITAL_COMMUNITY): Payer: Self-pay

## 2022-12-26 NOTE — Telephone Encounter (Signed)
Pharmacy Patient Advocate Encounter  Received notification from [facility] that Prior Authorization for Mounjaro 7.5mg /0.66ml has been APPROVED from 12-25-2022 to 12-24-2025

## 2022-12-27 ENCOUNTER — Other Ambulatory Visit (HOSPITAL_BASED_OUTPATIENT_CLINIC_OR_DEPARTMENT_OTHER): Payer: Self-pay

## 2022-12-27 ENCOUNTER — Encounter: Payer: Self-pay | Admitting: *Deleted

## 2022-12-27 NOTE — Telephone Encounter (Signed)
Called [facility] pharmacy spoke to Weldon, told her PA for Greggory Keen was approved. Courtney verbalized understanding and said it is ready for pt. Told her okay.

## 2022-12-27 NOTE — Telephone Encounter (Signed)
Left message on voicemail to call office.  

## 2023-01-01 ENCOUNTER — Other Ambulatory Visit (HOSPITAL_COMMUNITY): Payer: Self-pay

## 2023-01-25 ENCOUNTER — Other Ambulatory Visit (HOSPITAL_BASED_OUTPATIENT_CLINIC_OR_DEPARTMENT_OTHER): Payer: Self-pay

## 2023-02-04 ENCOUNTER — Other Ambulatory Visit: Payer: Self-pay | Admitting: Physician Assistant

## 2023-02-04 MED ORDER — VENLAFAXINE HCL ER 37.5 MG PO CP24: 37.5000 mg | ORAL_CAPSULE | Freq: Every day | ORAL | 0 refills | Status: DC

## 2023-02-05 MED ORDER — VENLAFAXINE HCL ER 150 MG PO CP24: 150.0000 mg | ORAL_CAPSULE | Freq: Every day | ORAL | 2 refills | Status: DC

## 2023-02-28 ENCOUNTER — Other Ambulatory Visit (HOSPITAL_BASED_OUTPATIENT_CLINIC_OR_DEPARTMENT_OTHER): Payer: Self-pay

## 2023-03-18 ENCOUNTER — Other Ambulatory Visit: Payer: Self-pay | Admitting: Physician Assistant

## 2023-05-03 ENCOUNTER — Other Ambulatory Visit (HOSPITAL_BASED_OUTPATIENT_CLINIC_OR_DEPARTMENT_OTHER): Payer: Self-pay

## 2023-05-04 ENCOUNTER — Other Ambulatory Visit (HOSPITAL_BASED_OUTPATIENT_CLINIC_OR_DEPARTMENT_OTHER): Payer: Self-pay

## 2023-07-23 ENCOUNTER — Other Ambulatory Visit: Payer: Self-pay | Admitting: Physician Assistant

## 2023-09-08 ENCOUNTER — Encounter: Payer: Self-pay | Admitting: Physician Assistant

## 2023-09-09 MED ORDER — TIRZEPATIDE 15 MG/0.5ML ~~LOC~~ SOAJ: 15.0000 mg | SUBCUTANEOUS | 2 refills | Status: DC

## 2023-09-25 ENCOUNTER — Other Ambulatory Visit: Payer: Self-pay | Admitting: Physician Assistant

## 2023-10-24 ENCOUNTER — Other Ambulatory Visit (HOSPITAL_BASED_OUTPATIENT_CLINIC_OR_DEPARTMENT_OTHER): Payer: Self-pay

## 2023-10-24 ENCOUNTER — Other Ambulatory Visit: Payer: Self-pay | Admitting: Physician Assistant

## 2023-10-24 ENCOUNTER — Encounter: Payer: Self-pay | Admitting: Physician Assistant

## 2023-10-24 ENCOUNTER — Telehealth: Payer: Self-pay | Admitting: *Deleted

## 2023-10-24 DIAGNOSIS — Z1231 Encounter for screening mammogram for malignant neoplasm of breast: Secondary | ICD-10-CM

## 2023-10-24 MED ORDER — TIRZEPATIDE 15 MG/0.5ML ~~LOC~~ SOAJ
15.0000 mg | SUBCUTANEOUS | 1 refills | Status: DC
  Filled 2023-10-24: qty 2, 28d supply, fill #0
  Filled 2023-11-22: qty 2, 28d supply, fill #1

## 2023-10-24 NOTE — Telephone Encounter (Signed)
 Copied from CRM [id number]. Topic: Clinical - Request for Lab/Test Order >> Oct 23, 2023  5:12 PM Lavanda D wrote: Reason for CRM: Patient has annual physical coming on Sept. 30th, she would like to have her A1c at that appointment.    ----------------------------------------------------------------------- From previous Reason for Contact - Scheduling: Patient/patient representative is calling to schedule an appointment. Refer to attachments for appointment information.

## 2023-10-24 NOTE — Telephone Encounter (Signed)
 Noted will add A1c to labs at CPE appt.

## 2023-10-30 MED ORDER — VENLAFAXINE HCL ER 150 MG PO CP24: 150.0000 mg | ORAL_CAPSULE | Freq: Every day | ORAL | 0 refills | Status: DC

## 2023-10-30 NOTE — Addendum Note (Signed)
 Addended by: THURMON ARLAND PARAS on: 10/30/2023 01:54 PM   Modules accepted: Orders

## 2023-11-11 ENCOUNTER — Other Ambulatory Visit: Payer: Self-pay | Admitting: Medical Genetics

## 2023-11-15 ENCOUNTER — Ambulatory Visit
Admission: RE | Admit: 2023-11-15 | Discharge: 2023-11-15 | Disposition: A | Discharge status: Home / Self Care | Payer: Self-pay | Source: Ambulatory Visit | Attending: Physician Assistant | Admitting: Physician Assistant

## 2023-11-15 DIAGNOSIS — Z1231 Encounter for screening mammogram for malignant neoplasm of breast: Secondary | ICD-10-CM

## 2023-11-18 ENCOUNTER — Other Ambulatory Visit: Payer: Self-pay | Admitting: Physician Assistant

## 2023-12-03 ENCOUNTER — Other Ambulatory Visit

## 2023-12-03 DIAGNOSIS — Z006 Encounter for examination for normal comparison and control in clinical research program: Secondary | ICD-10-CM

## 2023-12-13 LAB — GENECONNECT MOLECULAR SCREEN: Genetic Analysis Overall Interpretation: NEGATIVE

## 2023-12-24 ENCOUNTER — Other Ambulatory Visit (HOSPITAL_BASED_OUTPATIENT_CLINIC_OR_DEPARTMENT_OTHER): Payer: Self-pay

## 2023-12-24 ENCOUNTER — Encounter: Payer: Self-pay | Admitting: Physician Assistant

## 2023-12-24 ENCOUNTER — Other Ambulatory Visit (HOSPITAL_COMMUNITY)
Admission: RE | Admit: 2023-12-24 | Discharge: 2023-12-24 | Disposition: A | Discharge status: Home / Self Care | Source: Ambulatory Visit | Attending: Physician Assistant | Admitting: Physician Assistant

## 2023-12-24 ENCOUNTER — Ambulatory Visit: Payer: Self-pay | Admitting: Physician Assistant

## 2023-12-24 VITALS — BP 110/64 | HR 66 | Temp 97.9°F | Ht 65.5 in | Wt 165.5 lb

## 2023-12-24 DIAGNOSIS — M609 Myositis, unspecified: Secondary | ICD-10-CM

## 2023-12-24 DIAGNOSIS — F418 Other specified anxiety disorders: Secondary | ICD-10-CM

## 2023-12-24 DIAGNOSIS — E119 Type 2 diabetes mellitus without complications: Secondary | ICD-10-CM | POA: Diagnosis not present

## 2023-12-24 DIAGNOSIS — Z7985 Long-term (current) use of injectable non-insulin antidiabetic drugs: Secondary | ICD-10-CM

## 2023-12-24 DIAGNOSIS — Z124 Encounter for screening for malignant neoplasm of cervix: Secondary | ICD-10-CM | POA: Insufficient documentation

## 2023-12-24 DIAGNOSIS — Z0000 Encounter for general adult medical examination without abnormal findings: Secondary | ICD-10-CM

## 2023-12-24 DIAGNOSIS — J301 Allergic rhinitis due to pollen: Secondary | ICD-10-CM

## 2023-12-24 DIAGNOSIS — E785 Hyperlipidemia, unspecified: Secondary | ICD-10-CM | POA: Diagnosis not present

## 2023-12-24 DIAGNOSIS — Z23 Encounter for immunization: Secondary | ICD-10-CM | POA: Diagnosis not present

## 2023-12-24 DIAGNOSIS — T466X5A Adverse effect of antihyperlipidemic and antiarteriosclerotic drugs, initial encounter: Secondary | ICD-10-CM

## 2023-12-24 DIAGNOSIS — F5101 Primary insomnia: Secondary | ICD-10-CM

## 2023-12-24 LAB — CBC WITH DIFFERENTIAL/PLATELET
Basophils Absolute: 0 K/uL (ref 0.0–0.1)
Basophils Relative: 0.9 % (ref 0.0–3.0)
Eosinophils Absolute: 0.1 K/uL (ref 0.0–0.7)
Eosinophils Relative: 1.9 % (ref 0.0–5.0)
HCT: 37.4 % (ref 36.0–46.0)
Hemoglobin: 12.6 g/dL (ref 12.0–15.0)
Lymphocytes Relative: 29.1 % (ref 12.0–46.0)
Lymphs Abs: 1.6 K/uL (ref 0.7–4.0)
MCHC: 33.7 g/dL (ref 30.0–36.0)
MCV: 83 fl (ref 78.0–100.0)
Monocytes Absolute: 0.3 K/uL (ref 0.1–1.0)
Monocytes Relative: 5.6 % (ref 3.0–12.0)
Neutro Abs: 3.3 K/uL (ref 1.4–7.7)
Neutrophils Relative %: 62.5 % (ref 43.0–77.0)
Platelets: 235 K/uL (ref 150.0–400.0)
RBC: 4.51 Mil/uL (ref 3.87–5.11)
RDW: 13.7 % (ref 11.5–15.5)
WBC: 5.3 K/uL (ref 4.0–10.5)

## 2023-12-24 LAB — LIPID PANEL
Cholesterol: 216 mg/dL — ABNORMAL HIGH (ref 0–200)
HDL: 49.6 mg/dL (ref >39.00)
LDL Cholesterol: 150 mg/dL — ABNORMAL HIGH (ref 0–99)
NonHDL: 165.96
Total CHOL/HDL Ratio: 4
Triglycerides: 78 mg/dL (ref 0.0–149.0)
VLDL: 15.6 mg/dL (ref 0.0–40.0)

## 2023-12-24 LAB — COMPREHENSIVE METABOLIC PANEL WITH GFR
ALT: 12 U/L (ref 0–35)
AST: 14 U/L (ref 0–37)
Albumin: 4.5 g/dL (ref 3.5–5.2)
Alkaline Phosphatase: 45 U/L (ref 39–117)
BUN: 13 mg/dL (ref 6–23)
CO2: 28 meq/L (ref 19–32)
Calcium: 9.6 mg/dL (ref 8.4–10.5)
Chloride: 104 meq/L (ref 96–112)
Creatinine, Ser: 0.8 mg/dL (ref 0.40–1.20)
GFR: 81.54 mL/min (ref >60.00)
Glucose, Bld: 87 mg/dL (ref 70–99)
Potassium: 4 meq/L (ref 3.5–5.1)
Sodium: 139 meq/L (ref 135–145)
Total Bilirubin: 0.5 mg/dL (ref 0.2–1.2)
Total Protein: 6.9 g/dL (ref 6.0–8.3)

## 2023-12-24 LAB — MICROALBUMIN / CREATININE URINE RATIO
Creatinine,U: 180.8 mg/dL
Microalb Creat Ratio: 4.9 mg/g (ref 0.0–30.0)
Microalb, Ur: 0.9 mg/dL (ref 0.0–1.9)

## 2023-12-24 LAB — HEMOGLOBIN A1C: Hgb A1c MFr Bld: 5.6 % (ref 4.6–6.5)

## 2023-12-24 MED ORDER — VENLAFAXINE HCL ER 150 MG PO CP24
150.0000 mg | ORAL_CAPSULE | Freq: Every day | ORAL | 3 refills | Status: AC
  Filled 2023-12-24: qty 90, 90d supply, fill #0
  Filled 2024-03-17: qty 90, 90d supply, fill #1

## 2023-12-24 MED ORDER — LORATADINE 10 MG PO TABS
10.0000 mg | ORAL_TABLET | Freq: Every day | ORAL | 3 refills | Status: AC
  Filled 2023-12-24: qty 90, 90d supply, fill #0
  Filled 2024-03-17: qty 90, 90d supply, fill #1

## 2023-12-24 MED ORDER — TIRZEPATIDE 15 MG/0.5ML ~~LOC~~ SOAJ
15.0000 mg | SUBCUTANEOUS | 1 refills | Status: AC
  Filled 2023-12-24: qty 6, 84d supply, fill #0
  Filled 2024-03-17: qty 6, 84d supply, fill #1

## 2023-12-24 NOTE — Progress Notes (Signed)
 Subjective:    Kelli Evans is a 58 y.o. female and is here for a comprehensive physical exam.  HPI  Health Maintenance Due  Topic Date Due   HIV Screening  Never done   Diabetic kidney evaluation - Urine ACR  Never done   Hepatitis B Vaccines 19-59 Average Risk (1 of 3 - 19+ 3-dose series) Never done   Cervical Cancer Screening (HPV/Pap Cotest)  09/25/2022   HEMOGLOBIN A1C  04/07/2023   Diabetic kidney evaluation - eGFR measurement  10/05/2023   Discussed the use of AI scribe software for clinical note transcription with the patient, who gave verbal consent to proceed.  History of Present Illness   Kelli Evans is a 58 year old female who presents for Comprehensive Physical Exam (CPE) preventive care annual visit.  She has experienced significant weight loss since starting Mounjaro  15 mg weekly, with initial rapid loss now tapering. She is on the maximum dose, taken weekly, and noticed increased appetite after missing a dose last week. Side effects include muscle tone loss, hair thinning, and severe gastrointestinal symptoms such as diarrhea and vomiting, typically occurring on the second day post-injection. These episodes resolve within three to four hours and are often preceded by acid reflux and gas. She takes biotin supplements for hair thinning and uses [place] products.  She exercises regularly, attending senior aerobics classes twice a week and walking her dog. She follows a Mediterranean diet and has been doing so since the 1980s. She reports poor sleep, waking every one to two hours, but usually returns to sleep within 10-15 minutes. She attributes some disturbances to her pets. She has not tried melatonin or other sleep aids recently.       Health Maintenance: Immunizations -- UpToDate  Colonoscopy -- UpToDate  Mammogram -- had genetic testing; BRCA negative PAP -- due today Bone Density -- N/A  Diet -- overall mediterranean  Exercise -- [employer]  classes with [place]; strength training  Sleep habits -- wakes up multiple times night Mood -- overall stable  UTD with dentist? - yes UTD with eye doctor? - no  Weight history: Wt Readings from Last 10 Encounters:  12/24/23 165 lb 8 oz (75.1 kg)  11/14/22 189 lb 4 oz (85.8 kg)  10/05/22 186 lb 4 oz (84.5 kg)  01/18/22 185 lb (83.9 kg)  12/07/21 184 lb (83.5 kg)  11/06/21 186 lb 6.4 oz (84.6 kg)  08/23/21 189 lb 4 oz (85.8 kg)  04/26/21 202 lb 4 oz (91.7 kg)  09/29/20 202 lb 9.6 oz (91.9 kg)  10/26/19 195 lb (88.5 kg)   Body mass index is 27.12 kg/m. Patient's last menstrual period was 08/09/2019 (exact date).  Alcohol use:  reports current alcohol use of about 3.0 standard drinks of alcohol per week.  Tobacco use:  Tobacco Use: Low Risk  (12/24/2023)   Patient History    Smoking Tobacco Use: Never    Smokeless Tobacco Use: Never    Passive Exposure: Not on file   Eligible for lung cancer screening? no     12/24/2023    1:47 PM  Depression screen PHQ 2/9  Decreased Interest 1  Down, Depressed, Hopeless 1  PHQ - 2 Score 2  Altered sleeping 2  Tired, decreased energy 2  Change in appetite 1  Feeling bad or failure about yourself  1  Trouble concentrating 2  Moving slowly or fidgety/restless 0  Suicidal thoughts 0  PHQ-9 Score 10  Difficult doing  work/chores Not difficult at all     Other providers/specialists: Patient Care Team: Job Lukes, [place] as PCP - General (Physician Assistant) Court Pulling, MD as Referring Physician (Dermatology)    PMHx, SurgHx, SocialHx, Medications, and Allergies were reviewed in the Visit Navigator and updated as appropriate.   Past Medical History:  Diagnosis Date   Allergy    Anemia    past hx of anemia 2019   Anxiety    Arthritis    In my chart, 2years ago?   Depression    Diabetes mellitus without complication (HCC)    GERD (gastroesophageal reflux disease)    Heart murmur 1996   I have a right bundle  branch blockage   Hyperlipidemia    no meds   Hypothyroidism    past hx      Past Surgical History:  Procedure Laterality Date   WISDOM TOOTH EXTRACTION       Family History  Problem Relation Age of Onset   Breast cancer Mother    Cancer Mother 60       breast   Colon polyps Mother    Hypertension Father    Heart disease Father        chf, pacemaker   Cancer Father        brain   Hyperlipidemia Father    Hypothyroidism Father    Brain cancer Father    Colon polyps Father    Arthritis Father    Hearing loss Father    Other Sister        tamoxifen x 3 yrs for a pre cancereous  issue   Breast cancer Maternal Grandmother    Cancer Maternal Grandmother    Cancer Maternal Grandfather    Cancer Cousin 92       breast    Colon cancer Neg Hx    Esophageal cancer Neg Hx    Rectal cancer Neg Hx    Stomach cancer Neg Hx     Social History   Tobacco Use   Smoking status: Never   Smokeless tobacco: Never  Substance Use Topics   Alcohol use: Yes    Alcohol/week: 3.0 standard drinks of alcohol    Types: 2 Glasses of wine, 1 Standard drinks or equivalent per week    Comment: once a month    Drug use: No    Review of Systems:   Review of Systems  Constitutional:  Negative for chills, fever, malaise/fatigue and weight loss.  HENT:  Negative for hearing loss, sinus pain and sore throat.   Respiratory:  Negative for cough and hemoptysis.   Cardiovascular:  Negative for chest pain, palpitations, leg swelling and PND.  Gastrointestinal:  Negative for abdominal pain, constipation, diarrhea, heartburn, nausea and vomiting.  Genitourinary:  Negative for dysuria, frequency and urgency.  Musculoskeletal:  Negative for back pain, myalgias and neck pain.  Skin:  Negative for itching and rash.  Neurological:  Negative for dizziness, tingling, seizures and headaches.  Endo/Heme/Allergies:  Negative for polydipsia.  Psychiatric/Behavioral:  Negative for depression. The patient is  not nervous/anxious.     Objective:   BP 110/64 (BP Location: Left Arm, Patient Position: Sitting, Cuff Size: Normal)   Pulse 66   Temp 97.9 F (36.6 C) (Temporal)   Ht 5' 5.5 (1.664 m)   Wt 165 lb 8 oz (75.1 kg)   LMP 08/09/2019 (Exact Date)   SpO2 99%   BMI 27.12 kg/m  Body mass index is 27.12 kg/m.   General Appearance:  Alert, cooperative, no distress, appears stated age  Head:    Normocephalic, without obvious abnormality, atraumatic  Eyes:    PERRL, conjunctiva/corneas clear, EOM's intact, fundi    benign, both eyes  Ears:    Normal TM's and external ear canals, both ears  Nose:   Nares normal, septum midline, mucosa normal, no drainage    or sinus tenderness  Throat:   Lips, mucosa, and tongue normal; teeth and gums normal  Neck:   Supple, symmetrical, trachea midline, no adenopathy;    thyroid :  no enlargement/tenderness/nodules; no carotid   bruit or JVD  Back:     Symmetric, no curvature, ROM normal, no CVA tenderness  Lungs:     Clear to auscultation bilaterally, respirations unlabored  Chest Wall:    No tenderness or deformity   Heart:    Regular rate and rhythm, S1 and S2 normal, no murmur, rub or gallop  Breast Exam:    Deferred  Abdomen:     Soft, non-tender, bowel sounds active all four quadrants,    no masses, no organomegaly  Genitalia:    Normal vagina, cervix, adnexa; no discharge or tenderness to palpation   Extremities:   Extremities normal, atraumatic, no cyanosis or edema  Pulses:   2+ and symmetric all extremities  Skin:   Skin color, texture, turgor normal, no rashes or lesions  Lymph nodes:   Cervical, supraclavicular, and axillary nodes normal  Neurologic:   CNII-XII intact, normal strength, sensation and reflexes    throughout    Assessment/Plan:   Assessment and Plan    Comprehensive Physical Exam (CPE) preventive care annual visit Today patient counseled on age appropriate routine health concerns for screening and prevention, each  reviewed and up to date or declined. Immunizations reviewed and up to date or declined. Labs ordered and reviewed. Risk factors for depression reviewed and negative. Hearing function and visual acuity are intact. ADLs screened and addressed as needed. Functional ability and level of safety reviewed and appropriate. Education, counseling and referrals performed based on assessed risks today. Patient provided with a copy of personalized plan for preventive services.  Diabetes mellitus  Managed with Mounjaro  (GLP-1 agonist) resulting in significant weight loss. Adverse effects include muscle tone loss, hair thinning, and severe gastrointestinal symptoms post-injection at 15 mg dose. Hair thinning attributed to rapid weight loss, expected to improve. - Continue Mounjaro  at current dose. - Monitor for adverse effects and consider dose reduction if GI symptoms persist. - Encourage strength training to counteract muscle loss. - Reassure regarding hair thinning and continue biotin supplementation.  Depression with Anxiety Currently managed with Effexor . Considering dose reduction due to weight loss and increased cost. Effexor  may contribute to insomnia if taken at night. - Reduce Effexor  to 150 mg daily. - Consider switching Effexor  administration to morning to assess impact on sleep.  Insomnia Reports frequent awakenings. Potential contributing factors include Effexor  taken at night. Discussed non-pharmacological interventions and over-the-counter options. Advised against regular use of diphenhydramine. - Consider melatonin, starting with 1-10 mg, taken 1 hour before bedtime. - Implement sleep hygiene measures. - Avoid regular use of diphenhydramine-containing products.  Allergic rhinitis Experiencing itchy and flaky ears, possibly exacerbated by Mounjaro . Previously managed with Allegra and Claritin . - Send prescription for Claritin . - Consider over-the-counter options at [facility] for  cost savings.  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 Statin therapy indicated due to diabetic diagnosis. - Await blood work results to determine need for statin  therapy. - Consider low-dose statin therapy if indicated.        Lucie Buttner, PA-C [facility]

## 2023-12-25 LAB — CYTOLOGY - PAP
Adequacy: ABSENT
Diagnosis: NEGATIVE

## 2023-12-26 ENCOUNTER — Ambulatory Visit: Payer: Self-pay | Admitting: Physician Assistant

## 2024-03-17 ENCOUNTER — Other Ambulatory Visit (HOSPITAL_BASED_OUTPATIENT_CLINIC_OR_DEPARTMENT_OTHER): Payer: Self-pay

## 2024-04-15 LAB — OPHTHALMOLOGY REPORT-SCANNED

## 2024-12-24 ENCOUNTER — Encounter: Admitting: Physician Assistant
